# Patient Record
Sex: Female | Born: 1955 | Hispanic: Refuse to answer | Marital: Married | Smoking: Never smoker
Health system: Southern US, Community
[De-identification: ages and names within clinical notes are randomized; demographics above are authoritative.]

## PROBLEM LIST (undated history)

## (undated) DIAGNOSIS — G4730 Sleep apnea, unspecified: Secondary | ICD-10-CM

## (undated) DIAGNOSIS — M810 Age-related osteoporosis without current pathological fracture: Secondary | ICD-10-CM

## (undated) DIAGNOSIS — I499 Cardiac arrhythmia, unspecified: Secondary | ICD-10-CM

## (undated) DIAGNOSIS — E785 Hyperlipidemia, unspecified: Secondary | ICD-10-CM

## (undated) DIAGNOSIS — F458 Other somatoform disorders: Secondary | ICD-10-CM

## (undated) DIAGNOSIS — M1990 Unspecified osteoarthritis, unspecified site: Secondary | ICD-10-CM

## (undated) HISTORY — DX: Cardiac arrhythmia, unspecified: I49.9

## (undated) HISTORY — PX: COLONOSCOPY: SHX174

## (undated) HISTORY — DX: Hyperlipidemia, unspecified: E78.5

## (undated) HISTORY — DX: Age-related osteoporosis without current pathological fracture: M81.0

## (undated) HISTORY — PX: TONSILLECTOMY: SUR1361

## (undated) HISTORY — DX: Sleep apnea, unspecified: G47.30

## (undated) HISTORY — DX: Unspecified osteoarthritis, unspecified site: M19.90

## (undated) HISTORY — DX: Other somatoform disorders: F45.8

## (undated) HISTORY — PX: BREAST BIOPSY: SHX20

---

## 2011-12-09 ENCOUNTER — Other Ambulatory Visit (HOSPITAL_COMMUNITY)
Admission: RE | Admit: 2011-12-09 | Discharge: 2011-12-09 | Disposition: A | Discharge status: Home / Self Care | Payer: 59 | Source: Ambulatory Visit | Attending: Family Medicine | Admitting: Family Medicine

## 2011-12-09 ENCOUNTER — Other Ambulatory Visit: Payer: Self-pay | Admitting: Family Medicine

## 2011-12-09 DIAGNOSIS — Z1231 Encounter for screening mammogram for malignant neoplasm of breast: Secondary | ICD-10-CM

## 2011-12-09 DIAGNOSIS — Z124 Encounter for screening for malignant neoplasm of cervix: Secondary | ICD-10-CM | POA: Insufficient documentation

## 2011-12-09 DIAGNOSIS — Z1159 Encounter for screening for other viral diseases: Secondary | ICD-10-CM | POA: Insufficient documentation

## 2011-12-19 ENCOUNTER — Ambulatory Visit
Admission: RE | Admit: 2011-12-19 | Discharge: 2011-12-19 | Disposition: A | Discharge status: Home / Self Care | Payer: 59 | Source: Ambulatory Visit | Attending: Family Medicine | Admitting: Family Medicine

## 2011-12-19 DIAGNOSIS — Z1231 Encounter for screening mammogram for malignant neoplasm of breast: Secondary | ICD-10-CM

## 2013-04-12 ENCOUNTER — Other Ambulatory Visit: Payer: Self-pay

## 2013-04-12 DIAGNOSIS — Z1231 Encounter for screening mammogram for malignant neoplasm of breast: Secondary | ICD-10-CM

## 2013-04-30 ENCOUNTER — Ambulatory Visit
Admission: RE | Admit: 2013-04-30 | Discharge: 2013-04-30 | Disposition: A | Discharge status: Home / Self Care | Payer: 59 | Source: Ambulatory Visit

## 2013-04-30 DIAGNOSIS — Z1231 Encounter for screening mammogram for malignant neoplasm of breast: Secondary | ICD-10-CM

## 2013-06-07 ENCOUNTER — Ambulatory Visit (HOSPITAL_BASED_OUTPATIENT_CLINIC_OR_DEPARTMENT_OTHER): Payer: 59 | Attending: Family Medicine | Admitting: Radiology

## 2013-06-07 VITALS — Ht 67.0 in | Wt 150.0 lb

## 2013-06-07 DIAGNOSIS — R0989 Other specified symptoms and signs involving the circulatory and respiratory systems: Secondary | ICD-10-CM | POA: Insufficient documentation

## 2013-06-07 DIAGNOSIS — R0609 Other forms of dyspnea: Secondary | ICD-10-CM | POA: Insufficient documentation

## 2013-06-07 DIAGNOSIS — G4733 Obstructive sleep apnea (adult) (pediatric): Secondary | ICD-10-CM | POA: Insufficient documentation

## 2013-06-12 DIAGNOSIS — G4733 Obstructive sleep apnea (adult) (pediatric): Secondary | ICD-10-CM

## 2013-06-12 NOTE — Procedures (Signed)
NAMEKELLSEY, [name]               ACCOUNT NO.:  [id number]  MEDICAL RECORD NO.:  [id number]          PATIENT TYPE:  OUT  LOCATION:  SLEEP CENTER                 FACILITY:  [facility]  PHYSICIAN:  Soul Hackman D. Maple Hudson, MD, FCCP, FACPDATE OF BIRTH:  02[birth year]15-1956  DATE OF STUDY:  06/07/2013                           NOCTURNAL POLYSOMNOGRAM  REFERRING PHYSICIAN:  Carilyn Goodpasture  REFERRING DOCTOR:  Carilyn Goodpasture, PA  INDICATION FOR STUDY:  Insomnia with sleep apnea.  EPWORTH SLEEPINESS SCORE:  11/24.  BMI 23.5, weight 150 pounds, height 67 inches, neck 13.5 inches.  MEDICATIONS:  Home medications charted for review.  SLEEP ARCHITECTURE:  Total sleep time 211 minutes with sleep efficiency 58.4%.  Stage I was 11.8%, stage II 69.2%, stage III 0.5%, REM 18.5% of total sleep time.  Sleep latency 77 minutes.  REM latency 95 minutes. Awake after sleep onset 72.5 minutes.  Arousal index 27.3.  Bedtime medication:  None.  She had difficulty sustaining sleep until 1:30 a.m.  RESPIRATORY DATA:  Apnea-hypopnea index (AHI) 6.3 per hour.  A total of 22 events was scored including 2 obstructive apneas and 20 hypopneas. Events were all associated with nonsupine sleep or with REM.  REM AHI 32.3 per hour.  Most events occurred around 2:00 a.m. while she was sleeping on her right side.  There were not of enough events to meet protocol criteria for application of split protocol CPAP titration.  OXYGEN DATA:  Moderate snoring with oxygen desaturation to a nadir of 82% and mean oxygen saturation through the study of 93.6% on room air.  CARDIAC DATA:  Normal sinus rhythm with occasional PAC.  MOVEMENT-PARASOMNIA:  No significant movement disturbance.  No bathroom trips.  IMPRESSION/RECOMMENDATION: 1. Mild obstructive sleep apnea/hypopnea syndrome, AHI 6.3 per hour     with mainly nonsupine and REM associated events.  REM AHI 32.3 per     hour.  Most events occurred in 1 cluster around 2 a.m. while lying     on her right side. 2. She did not have enough early sleep or events to meet protocol     requirements for CPAP trial.  Scores in this range are usually     managed conservatively, but if clinically     appropriate, a trial of CPAP or perhaps an oral appliance could be     considered. 3. She had significant difficulty initiating and maintaining sleep     until nearly 1:30 a.m.  If this is her usual pattern     at home, then managing as insomnia, with attention to sleep hygiene and     consideration of a sleep medication might be clinically appropriate.     Anber Mckiver D. Maple Hudson, MD, Tonny Bollman, FACP Diplomate, American Board of Sleep Medicine    CDY/MEDQ  D:  06/12/2013 09:58:34  T:  06/12/2013 10:23:48  Job:  [id number]

## 2013-08-01 ENCOUNTER — Ambulatory Visit (HOSPITAL_BASED_OUTPATIENT_CLINIC_OR_DEPARTMENT_OTHER): Payer: 59 | Attending: Family Medicine

## 2013-08-01 VITALS — Ht 67.0 in | Wt 153.0 lb

## 2013-08-01 DIAGNOSIS — G4733 Obstructive sleep apnea (adult) (pediatric): Secondary | ICD-10-CM

## 2013-08-01 DIAGNOSIS — G4710 Hypersomnia, unspecified: Secondary | ICD-10-CM | POA: Insufficient documentation

## 2013-08-07 DIAGNOSIS — G4733 Obstructive sleep apnea (adult) (pediatric): Secondary | ICD-10-CM

## 2013-08-07 NOTE — Sleep Study (Signed)
   NAME: Pamela Griffith DATE OF BIRTH:  28-Sep-1955 MEDICAL RECORD NUMBER [id number]  LOCATION: [facility]  PHYSICIAN: Kinnley Paulson D  DATE OF STUDY: 08/01/2013  SLEEP STUDY TYPE: Nocturnal Polysomnogram               REFERRING PHYSICIAN: Carilyn Goodpasture, PA-C  INDICATION FOR STUDY: Hypersomnia with sleep apnea. A baseline nocturnal polysomnogram on 06/07/2013 recorded AHI 6.3 per hour with body weight 150 pounds. CPAP titration is requested.  EPWORTH SLEEPINESS SCORE:   9/24 HEIGHT: 5\' 7"  (170.2 cm)  WEIGHT: 153 lb (69.4 kg)    Body mass index is 23.96 kg/(m^2).  NECK SIZE: 13.5 in.  MEDICATIONS: Charted for review  SLEEP ARCHITECTURE: Total sleep time 291 minutes with sleep efficiency 77.4%. Stage I was 6.5%, stage II 65.6%, stage III 6.7%, REM 21.1% of total sleep time. Sleep latency 37 minutes, REM latency 125 minutes. Awake after sleep onset 46.5 minutes, arousal index 15.1. Bedtime medication: None.  RESPIRATORY DATA: CPAP titration protocol. CPAP was titrated to 8 CWP. She wore a medium Fisher Paykel Eson nasal mask.  OXYGEN DATA: Snoring was prevented on final CPAP pressures and mean oxygen saturation held 94.9% on room air.  CARDIAC DATA: Normal sinus rhythm  MOVEMENT/PARASOMNIA: No significant movement disturbance. No bathroom trips.  IMPRESSION/ RECOMMENDATION:   1) Successful CPAP titration to 8 CWP, AHI 0 per hour. She wore a medium Fisher Paykel Eson mask, EPR 3, heated humidifier. Snoring was prevented and mean oxygen saturation of 94.9% on room air. 2) Baseline nocturnal polysomnogram on 06/07/2013 recorded AHI 6.3 per hour. Body weight was 150 pounds.   Signed Jetty Duhamel M.D. Waymon Budge Diplomate, American Board of Sleep Medicine  ELECTRONICALLY SIGNED ON:  08/07/2013, 2:58 PM [facility] PH: [phone]   FX: [phone] ACCREDITED BY THE AMERICAN ACADEMY OF SLEEP MEDICINE

## 2013-11-15 ENCOUNTER — Telehealth: Payer: Self-pay | Admitting: Internal Medicine

## 2013-11-15 NOTE — Telephone Encounter (Signed)
Spoke with Pamela HatchAnn at [facility]. Pt is having issues with her CPAP machine. Armanda Heritagedvised Pamela Griffith that we have not seen this pt in the office. [facility] read her sleep study in 07/2013 which was ordered by Carilyn GoodpastureJennifer Willard, PA. Pamela Hatchnn is wanting to get pt set up with an appointment to get her issues taken care of. Advised Pamela Griffith that [facility] will not have any openings any time soon. Pt has been scheduled with [facility] on 11/29/13 at 9:15am.  Pamela Hatchnn will contact the pt will appointment date and time. Pt will call back if this day and time doesn't work for her.

## 2013-11-15 NOTE — Telephone Encounter (Signed)
Left message for Dewayne HatchAnn (clinical and lab supervisor at [facility]) x1

## 2013-11-29 ENCOUNTER — Encounter (INDEPENDENT_AMBULATORY_CARE_PROVIDER_SITE_OTHER): Payer: Self-pay

## 2013-11-29 ENCOUNTER — Encounter: Payer: Self-pay | Admitting: Pulmonary Disease

## 2013-11-29 ENCOUNTER — Ambulatory Visit (INDEPENDENT_AMBULATORY_CARE_PROVIDER_SITE_OTHER): Payer: 59 | Admitting: Pulmonary Disease

## 2013-11-29 VITALS — BP 112/64 | HR 66 | Temp 98.0°F | Ht 66.0 in | Wt 159.0 lb

## 2013-11-29 DIAGNOSIS — G4733 Obstructive sleep apnea (adult) (pediatric): Secondary | ICD-10-CM

## 2013-11-29 NOTE — Assessment & Plan Note (Signed)
She has snoring, sleep disruption, witnessed apnea, and daytime sleepiness.  Her recent sleep study showed mild obstructive sleep apnea, but with significant REM effect.  I have reviewed the recent sleep study results with the patient.  We discussed how sleep apnea can affect various health problems including risks for hypertension, cardiovascular disease, and diabetes.  We also discussed how sleep disruption can increase risks for accident, such as while driving.  Weight loss as a means of improving sleep apnea was also reviewed.  Additional treatment options discussed were CPAP therapy, oral appliance, and surgical intervention.  She has difficulty adjusting to CPAP due to pressure setting, mask fit, and aerophagia.  She is willing to continue with CPAP for now.  Will decrease her CPAP setting from 8 to 6 cm H2O and arrange for CPAP mask refit.  If these efforts are unsuccessful, then she may need to be evaluated for an oral appliance.  She will check with her dentist about whether there may be an issue with her using an oral appliance especially with difficulty with TMJ clicking.

## 2013-11-29 NOTE — Patient Instructions (Signed)
Will change CPAP setting to 6 cm H2O and arrange for new CPAP mask Follow up in 6 weeks

## 2013-11-29 NOTE — Progress Notes (Signed)
Chief Complaint  Patient presents with  . Sleep Consult    referred by Carilyn GoodpastureJennifer Willard, PA for sleep issues. Epworth Score: 9.    History of Present Illness: Pamela Griffith is a 58 y.o. female for evaluation of sleep problems.  Her husband has been concerned about her snoring, and that she stops breathing while asleep.  This has been getting worse.  She will also wake up feeling short of breath and her heart pounding.  As a result she had a sleep study in October 2014 which showed mild sleep apnea.  She then had CPAP titration in December 2014.  She was started on CPAP 8 cm H2O with nasal pillows masks.  She has not been able to tolerate using CPAP.  As a result she was referred to pulmonary/sleep medicine.  She feels like she can't breath when she uses CPAP sometimes.  She also has been swallowing air into her stomach > this causes so much pain she has to wake up.  She also sleeps on her stomach, and was concerned that other mask types would not work for her.  She has developed anxiety about not being able to sleep while using CPAP > she was tried on xanax, but this did not help.  She feels her CPAP setting is too high.  As a result she has not used CPAP since April 2015.  At most she used CPAP for 4 hours in one night > when she does use CPAP it helps her sleep quality.  She does get sleepy during the day, and will fall asleep in the evening when sitting quiet.  She has a history of bruxism, and wears a mouth guard.  She goes to sleep at 11 pm.  She falls asleep after 20 minutes when not using CPAP, but takes 1 to 3 hours with CPAP.  She wakes up one time to use the bathroom.  She gets out of bed at 7 am.  She feels tire in the morning.  She denies morning headache.  She does not use anything to help her fall sleep or stay awake.  She denies sleep walking, or nightmares.  There is no history of restless legs.  She denies sleep hallucinations, sleep paralysis, or cataplexy.  The Epworth score is  9 out of 24.  Tests: PSG 06/07/13 >> AHI 6.3, REM AHI 32.3, SpO2 low 82% CPAP titration 08/01/13 >> CPAP 8 cm H2O  Pamela Griffith  has a past medical history of Abnormal heart rhythm; Hyperlipidemia; Sleep apnea; and Bruxism.  Pamela Griffith  has past surgical history that includes Breast biopsy and Tonsillectomy.  Prior to Admission medications   Medication Sig Start Date End Date Taking? Authorizing Provider  ALPRAZolam Prudy Feeler(XANAX) 0.5 MG tablet Take 0.5 mg by mouth at bedtime as needed for anxiety.   Yes Historical Provider, MD    Allergies  Allergen Reactions  . Penicillins Rash    Her family history includes Cancer in her mother; Heart disease in her father and mother.  She  reports that she has never smoked. She has never used smokeless tobacco. She reports that she drinks alcohol. She reports that she does not use illicit drugs.  Review of Systems  Constitutional: Negative for fever, chills, diaphoresis, activity change, appetite change, fatigue and unexpected weight change.  HENT: Negative for congestion, dental problem, ear discharge, ear pain, facial swelling, hearing loss, mouth sores, nosebleeds, postnasal drip, rhinorrhea, sinus pressure, sneezing, sore throat, tinnitus, trouble swallowing and voice change.  Eyes: Negative for photophobia, discharge, itching and visual disturbance.  Respiratory: Negative for apnea, cough, choking, chest tightness, shortness of breath, wheezing and stridor.   Cardiovascular: Positive for palpitations. Negative for chest pain and leg swelling.  Gastrointestinal: Positive for abdominal pain. Negative for nausea, vomiting, constipation, blood in stool and abdominal distention.  Genitourinary: Negative for dysuria, urgency, frequency, hematuria, flank pain, decreased urine volume and difficulty urinating.  Musculoskeletal: Negative for arthralgias, back pain, gait problem, joint swelling, myalgias, neck pain and neck stiffness.  Skin: Negative for  color change, pallor and rash.  Neurological: Negative for dizziness, tremors, seizures, syncope, speech difficulty, weakness, light-headedness, numbness and headaches.  Hematological: Negative for adenopathy. Does not bruise/bleed easily.  Psychiatric/Behavioral: Negative for confusion, sleep disturbance and agitation. The patient is not nervous/anxious.    Physical Exam:  General - No distress ENT - No sinus tenderness, no oral exudate, no LAN, no thyromegaly, TM clear, pupils equal/reactive, click in TMJ, MP 2, decreased AP diameter, high arch palate Cardiac - s1s2 regular, no murmur, pulses symmetric Chest - No wheeze/rales/dullness, good air entry, normal respiratory excursion Back - No focal tenderness Abd - Soft, non-tender, no organomegaly, + bowel sounds Ext - No edema Neuro - Normal strength, cranial nerves intact Skin - No rashes Psych - Normal mood, and behavior  Assessment/plan:  Coralyn HellingVineet Heela Heishman, M.D. Pager [phone]

## 2013-11-29 NOTE — Progress Notes (Deleted)
   Subjective:    Patient ID: Pamela Griffith, female    DOB: [birth year], 58 y.o.   MRN: [id number]  HPI    Review of Systems  Constitutional: Negative for fever, chills, diaphoresis, activity change, appetite change, fatigue and unexpected weight change.  HENT: Negative for congestion, dental problem, ear discharge, ear pain, facial swelling, hearing loss, mouth sores, nosebleeds, postnasal drip, rhinorrhea, sinus pressure, sneezing, sore throat, tinnitus, trouble swallowing and voice change.   Eyes: Negative for photophobia, discharge, itching and visual disturbance.  Respiratory: Negative for apnea, cough, choking, chest tightness, shortness of breath, wheezing and stridor.   Cardiovascular: Positive for palpitations. Negative for chest pain and leg swelling.  Gastrointestinal: Positive for abdominal pain. Negative for nausea, vomiting, constipation, blood in stool and abdominal distention.  Genitourinary: Negative for dysuria, urgency, frequency, hematuria, flank pain, decreased urine volume and difficulty urinating.  Musculoskeletal: Negative for arthralgias, back pain, gait problem, joint swelling, myalgias, neck pain and neck stiffness.  Skin: Negative for color change, pallor and rash.  Neurological: Negative for dizziness, tremors, seizures, syncope, speech difficulty, weakness, light-headedness, numbness and headaches.  Hematological: Negative for adenopathy. Does not bruise/bleed easily.  Psychiatric/Behavioral: Negative for confusion, sleep disturbance and agitation. The patient is not nervous/anxious.        Objective:   Physical Exam        Assessment & Plan:

## 2013-12-31 ENCOUNTER — Telehealth: Payer: Self-pay | Admitting: Pulmonary Disease

## 2013-12-31 NOTE — Telephone Encounter (Signed)
Noted  

## 2013-12-31 NOTE — Telephone Encounter (Signed)
Noted will send to [facility] as an [place]

## 2014-06-16 ENCOUNTER — Ambulatory Visit: Payer: 59 | Admitting: Pulmonary Disease

## 2015-05-04 ENCOUNTER — Other Ambulatory Visit: Payer: Self-pay

## 2015-05-04 DIAGNOSIS — Z1231 Encounter for screening mammogram for malignant neoplasm of breast: Secondary | ICD-10-CM

## 2015-05-15 ENCOUNTER — Telehealth: Payer: Self-pay | Admitting: Gastroenterology

## 2015-05-15 NOTE — Telephone Encounter (Signed)
Received GI records from [facility] and placed on Dr. Christella Hartigan desk for review.

## 2015-06-12 ENCOUNTER — Ambulatory Visit
Admission: RE | Admit: 2015-06-12 | Discharge: 2015-06-12 | Disposition: A | Discharge status: Home / Self Care | Payer: 59 | Source: Ambulatory Visit

## 2015-06-12 DIAGNOSIS — Z1231 Encounter for screening mammogram for malignant neoplasm of breast: Secondary | ICD-10-CM

## 2016-05-10 ENCOUNTER — Other Ambulatory Visit (HOSPITAL_COMMUNITY)
Admission: RE | Admit: 2016-05-10 | Discharge: 2016-05-10 | Disposition: A | Discharge status: Home / Self Care | Payer: Managed Care, Other (non HMO) | Source: Ambulatory Visit | Attending: Family Medicine | Admitting: Family Medicine

## 2016-05-10 ENCOUNTER — Other Ambulatory Visit: Payer: Self-pay | Admitting: Family Medicine

## 2016-05-10 DIAGNOSIS — Z01411 Encounter for gynecological examination (general) (routine) with abnormal findings: Secondary | ICD-10-CM | POA: Diagnosis not present

## 2016-05-14 LAB — CYTOLOGY - PAP

## 2016-05-16 ENCOUNTER — Other Ambulatory Visit: Payer: Self-pay | Admitting: Family Medicine

## 2016-05-16 DIAGNOSIS — Z1231 Encounter for screening mammogram for malignant neoplasm of breast: Secondary | ICD-10-CM

## 2016-06-17 ENCOUNTER — Ambulatory Visit
Admission: RE | Admit: 2016-06-17 | Discharge: 2016-06-17 | Disposition: A | Discharge status: Home / Self Care | Payer: Managed Care, Other (non HMO) | Source: Ambulatory Visit | Attending: Family Medicine | Admitting: Family Medicine

## 2016-06-17 DIAGNOSIS — Z1231 Encounter for screening mammogram for malignant neoplasm of breast: Secondary | ICD-10-CM

## 2017-05-13 ENCOUNTER — Other Ambulatory Visit: Payer: Self-pay | Admitting: Family Medicine

## 2017-05-13 DIAGNOSIS — Z1231 Encounter for screening mammogram for malignant neoplasm of breast: Secondary | ICD-10-CM

## 2017-06-23 ENCOUNTER — Ambulatory Visit: Payer: Managed Care, Other (non HMO)

## 2017-06-30 ENCOUNTER — Ambulatory Visit
Admission: RE | Admit: 2017-06-30 | Discharge: 2017-06-30 | Disposition: A | Discharge status: Home / Self Care | Payer: Managed Care, Other (non HMO) | Source: Ambulatory Visit | Attending: Family Medicine | Admitting: Family Medicine

## 2017-06-30 DIAGNOSIS — Z1231 Encounter for screening mammogram for malignant neoplasm of breast: Secondary | ICD-10-CM

## 2018-06-11 ENCOUNTER — Other Ambulatory Visit: Payer: Self-pay | Admitting: Family Medicine

## 2018-06-11 DIAGNOSIS — Z1231 Encounter for screening mammogram for malignant neoplasm of breast: Secondary | ICD-10-CM

## 2018-07-23 ENCOUNTER — Ambulatory Visit: Payer: Managed Care, Other (non HMO)

## 2019-02-05 ENCOUNTER — Encounter: Payer: Self-pay | Admitting: Gastroenterology

## 2019-09-15 DIAGNOSIS — R69 Illness, unspecified: Secondary | ICD-10-CM | POA: Diagnosis not present

## 2019-12-29 DIAGNOSIS — R7301 Impaired fasting glucose: Secondary | ICD-10-CM | POA: Diagnosis not present

## 2019-12-29 DIAGNOSIS — E785 Hyperlipidemia, unspecified: Secondary | ICD-10-CM | POA: Diagnosis not present

## 2019-12-29 DIAGNOSIS — E559 Vitamin D deficiency, unspecified: Secondary | ICD-10-CM | POA: Diagnosis not present

## 2020-01-03 DIAGNOSIS — Z23 Encounter for immunization: Secondary | ICD-10-CM | POA: Diagnosis not present

## 2020-01-03 DIAGNOSIS — M189 Osteoarthritis of first carpometacarpal joint, unspecified: Secondary | ICD-10-CM | POA: Diagnosis not present

## 2020-01-03 DIAGNOSIS — R21 Rash and other nonspecific skin eruption: Secondary | ICD-10-CM | POA: Diagnosis not present

## 2020-01-03 DIAGNOSIS — Z1211 Encounter for screening for malignant neoplasm of colon: Secondary | ICD-10-CM | POA: Diagnosis not present

## 2020-01-03 DIAGNOSIS — E559 Vitamin D deficiency, unspecified: Secondary | ICD-10-CM | POA: Diagnosis not present

## 2020-01-03 DIAGNOSIS — G4733 Obstructive sleep apnea (adult) (pediatric): Secondary | ICD-10-CM | POA: Diagnosis not present

## 2020-01-03 DIAGNOSIS — R7301 Impaired fasting glucose: Secondary | ICD-10-CM | POA: Diagnosis not present

## 2020-01-03 DIAGNOSIS — E785 Hyperlipidemia, unspecified: Secondary | ICD-10-CM | POA: Diagnosis not present

## 2020-01-03 DIAGNOSIS — Z0000 Encounter for general adult medical examination without abnormal findings: Secondary | ICD-10-CM | POA: Diagnosis not present

## 2020-04-10 DIAGNOSIS — H5203 Hypermetropia, bilateral: Secondary | ICD-10-CM | POA: Diagnosis not present

## 2020-04-10 DIAGNOSIS — Z0100 Encounter for examination of eyes and vision without abnormal findings: Secondary | ICD-10-CM | POA: Diagnosis not present

## 2020-04-17 DIAGNOSIS — R69 Illness, unspecified: Secondary | ICD-10-CM | POA: Diagnosis not present

## 2020-05-16 DIAGNOSIS — Z23 Encounter for immunization: Secondary | ICD-10-CM | POA: Diagnosis not present

## 2020-07-04 DIAGNOSIS — R69 Illness, unspecified: Secondary | ICD-10-CM | POA: Diagnosis not present

## 2021-01-23 DIAGNOSIS — R7303 Prediabetes: Secondary | ICD-10-CM | POA: Diagnosis not present

## 2021-01-23 DIAGNOSIS — E785 Hyperlipidemia, unspecified: Secondary | ICD-10-CM | POA: Diagnosis not present

## 2021-01-23 DIAGNOSIS — E559 Vitamin D deficiency, unspecified: Secondary | ICD-10-CM | POA: Diagnosis not present

## 2021-01-25 DIAGNOSIS — Z0000 Encounter for general adult medical examination without abnormal findings: Secondary | ICD-10-CM | POA: Diagnosis not present

## 2021-01-25 DIAGNOSIS — N952 Postmenopausal atrophic vaginitis: Secondary | ICD-10-CM | POA: Diagnosis not present

## 2021-01-25 DIAGNOSIS — G4733 Obstructive sleep apnea (adult) (pediatric): Secondary | ICD-10-CM | POA: Diagnosis not present

## 2021-01-25 DIAGNOSIS — E785 Hyperlipidemia, unspecified: Secondary | ICD-10-CM | POA: Diagnosis not present

## 2021-01-25 DIAGNOSIS — E559 Vitamin D deficiency, unspecified: Secondary | ICD-10-CM | POA: Diagnosis not present

## 2021-01-25 DIAGNOSIS — M189 Osteoarthritis of first carpometacarpal joint, unspecified: Secondary | ICD-10-CM | POA: Diagnosis not present

## 2021-01-25 DIAGNOSIS — R7303 Prediabetes: Secondary | ICD-10-CM | POA: Diagnosis not present

## 2021-01-25 DIAGNOSIS — M549 Dorsalgia, unspecified: Secondary | ICD-10-CM | POA: Diagnosis not present

## 2021-01-25 DIAGNOSIS — Z1211 Encounter for screening for malignant neoplasm of colon: Secondary | ICD-10-CM | POA: Diagnosis not present

## 2021-01-25 DIAGNOSIS — M5135 Other intervertebral disc degeneration, thoracolumbar region: Secondary | ICD-10-CM | POA: Diagnosis not present

## 2021-01-26 ENCOUNTER — Ambulatory Visit
Admission: RE | Admit: 2021-01-26 | Discharge: 2021-01-26 | Disposition: A | Discharge status: Home / Self Care | Payer: Medicare HMO | Source: Ambulatory Visit | Attending: Family Medicine | Admitting: Family Medicine

## 2021-01-26 ENCOUNTER — Other Ambulatory Visit: Payer: Self-pay | Admitting: Family Medicine

## 2021-01-26 DIAGNOSIS — M546 Pain in thoracic spine: Secondary | ICD-10-CM

## 2021-01-26 DIAGNOSIS — M4316 Spondylolisthesis, lumbar region: Secondary | ICD-10-CM | POA: Diagnosis not present

## 2021-01-26 DIAGNOSIS — M47817 Spondylosis without myelopathy or radiculopathy, lumbosacral region: Secondary | ICD-10-CM | POA: Diagnosis not present

## 2021-01-26 DIAGNOSIS — M48061 Spinal stenosis, lumbar region without neurogenic claudication: Secondary | ICD-10-CM | POA: Diagnosis not present

## 2021-01-26 DIAGNOSIS — M47816 Spondylosis without myelopathy or radiculopathy, lumbar region: Secondary | ICD-10-CM | POA: Diagnosis not present

## 2021-02-26 DIAGNOSIS — M546 Pain in thoracic spine: Secondary | ICD-10-CM | POA: Diagnosis not present

## 2021-02-28 DIAGNOSIS — M419 Scoliosis, unspecified: Secondary | ICD-10-CM | POA: Diagnosis not present

## 2021-02-28 DIAGNOSIS — M545 Low back pain, unspecified: Secondary | ICD-10-CM | POA: Diagnosis not present

## 2021-02-28 DIAGNOSIS — M6281 Muscle weakness (generalized): Secondary | ICD-10-CM | POA: Diagnosis not present

## 2021-05-04 DIAGNOSIS — Z23 Encounter for immunization: Secondary | ICD-10-CM | POA: Diagnosis not present

## 2021-05-08 DIAGNOSIS — N644 Mastodynia: Secondary | ICD-10-CM | POA: Diagnosis not present

## 2021-05-08 DIAGNOSIS — R922 Inconclusive mammogram: Secondary | ICD-10-CM | POA: Diagnosis not present

## 2021-07-05 DIAGNOSIS — H5203 Hypermetropia, bilateral: Secondary | ICD-10-CM | POA: Diagnosis not present

## 2021-08-07 DIAGNOSIS — Z0100 Encounter for examination of eyes and vision without abnormal findings: Secondary | ICD-10-CM | POA: Diagnosis not present

## 2021-08-22 DIAGNOSIS — M1712 Unilateral primary osteoarthritis, left knee: Secondary | ICD-10-CM | POA: Diagnosis not present

## 2021-09-05 DIAGNOSIS — S83242A Other tear of medial meniscus, current injury, left knee, initial encounter: Secondary | ICD-10-CM | POA: Diagnosis not present

## 2021-09-05 DIAGNOSIS — M1712 Unilateral primary osteoarthritis, left knee: Secondary | ICD-10-CM | POA: Diagnosis not present

## 2022-01-25 DIAGNOSIS — E559 Vitamin D deficiency, unspecified: Secondary | ICD-10-CM | POA: Diagnosis not present

## 2022-01-25 DIAGNOSIS — R7303 Prediabetes: Secondary | ICD-10-CM | POA: Diagnosis not present

## 2022-01-25 DIAGNOSIS — E785 Hyperlipidemia, unspecified: Secondary | ICD-10-CM | POA: Diagnosis not present

## 2022-02-06 DIAGNOSIS — Z23 Encounter for immunization: Secondary | ICD-10-CM | POA: Diagnosis not present

## 2022-02-06 DIAGNOSIS — G4733 Obstructive sleep apnea (adult) (pediatric): Secondary | ICD-10-CM | POA: Diagnosis not present

## 2022-02-06 DIAGNOSIS — M5135 Other intervertebral disc degeneration, thoracolumbar region: Secondary | ICD-10-CM | POA: Diagnosis not present

## 2022-02-06 DIAGNOSIS — Z1211 Encounter for screening for malignant neoplasm of colon: Secondary | ICD-10-CM | POA: Diagnosis not present

## 2022-02-06 DIAGNOSIS — R7301 Impaired fasting glucose: Secondary | ICD-10-CM | POA: Diagnosis not present

## 2022-02-06 DIAGNOSIS — E785 Hyperlipidemia, unspecified: Secondary | ICD-10-CM | POA: Diagnosis not present

## 2022-02-06 DIAGNOSIS — Z0000 Encounter for general adult medical examination without abnormal findings: Secondary | ICD-10-CM | POA: Diagnosis not present

## 2022-02-06 DIAGNOSIS — E559 Vitamin D deficiency, unspecified: Secondary | ICD-10-CM | POA: Diagnosis not present

## 2022-02-18 DIAGNOSIS — M810 Age-related osteoporosis without current pathological fracture: Secondary | ICD-10-CM | POA: Diagnosis not present

## 2022-02-18 DIAGNOSIS — M85851 Other specified disorders of bone density and structure, right thigh: Secondary | ICD-10-CM | POA: Diagnosis not present

## 2022-02-18 DIAGNOSIS — M85852 Other specified disorders of bone density and structure, left thigh: Secondary | ICD-10-CM | POA: Diagnosis not present

## 2022-02-18 DIAGNOSIS — Z780 Asymptomatic menopausal state: Secondary | ICD-10-CM | POA: Diagnosis not present

## 2022-03-01 ENCOUNTER — Encounter: Payer: Self-pay | Admitting: Gastroenterology

## 2022-03-14 ENCOUNTER — Other Ambulatory Visit: Payer: Self-pay | Admitting: *Deleted

## 2022-04-02 ENCOUNTER — Ambulatory Visit (AMBULATORY_SURGERY_CENTER): Payer: Self-pay | Admitting: *Deleted

## 2022-04-02 VITALS — Ht 66.0 in | Wt 146.0 lb

## 2022-04-02 DIAGNOSIS — Z1211 Encounter for screening for malignant neoplasm of colon: Secondary | ICD-10-CM

## 2022-04-02 NOTE — Progress Notes (Signed)
No egg or soy allergy known to patient  No issues known to pt with past sedation with any surgeries or procedures Patient denies ever being told they had issues or difficulty with intubation  No FH of Malignant Hyperthermia Pt is not on diet pills Pt is not on  home 02  Pt is not on blood thinners  Pt denies issues with constipation  No A fib or A flutter Have any cardiac testing pending--NO Pt instructed to use [employer] or GoodRx for a price reduction on prep   Sample sheet of over the counter items to purchase for prep given to pt.  

## 2022-04-11 ENCOUNTER — Encounter: Payer: Self-pay | Admitting: Internal Medicine

## 2022-04-23 ENCOUNTER — Encounter: Payer: Self-pay | Admitting: Internal Medicine

## 2022-04-23 ENCOUNTER — Encounter: Payer: Medicare HMO | Admitting: Gastroenterology

## 2022-04-23 ENCOUNTER — Ambulatory Visit (AMBULATORY_SURGERY_CENTER): Payer: Medicare HMO | Admitting: Internal Medicine

## 2022-04-23 VITALS — BP 111/53 | HR 60 | Temp 97.5°F | Resp 13 | Ht 66.0 in | Wt 146.0 lb

## 2022-04-23 DIAGNOSIS — Z1211 Encounter for screening for malignant neoplasm of colon: Secondary | ICD-10-CM

## 2022-04-23 MED ORDER — SODIUM CHLORIDE 0.9 % IV SOLN: 500.0000 mL | Freq: Once | INTRAVENOUS | Status: DC

## 2022-04-23 NOTE — Progress Notes (Signed)
To pacu, VSS. Report to [email or web address] 

## 2022-04-23 NOTE — Progress Notes (Signed)
Pt's states no medical or surgical changes since previsit or office visit. 

## 2022-04-23 NOTE — Op Note (Signed)
[facility] Patient Name: Pamela Griffith Procedure Date: 04/23/2022 3:51 PM MRN: [id number] Endoscopist: Iva Boop , MD Age: 66 Referring MD:  Date of Birth: [birth year] Gender: Female Account #: [id number] Procedure:                Colonoscopy Indications:              Screening for colorectal malignant neoplasm, Last                            colonoscopy: 2010 Medicines:                Monitored Anesthesia Care Procedure:                Pre-Anesthesia Assessment:                           - Prior to the procedure, a History and Physical                            was performed, and patient medications and                            allergies were reviewed. The patient's tolerance of                            previous anesthesia was also reviewed. The risks                            and benefits of the procedure and the sedation                            options and risks were discussed with the patient.                            All questions were answered, and informed consent                            was obtained. Prior Anticoagulants: The patient has                            taken no previous anticoagulant or antiplatelet                            agents. ASA Grade Assessment: II - A patient with                            mild systemic disease. After reviewing the risks                            and benefits, the patient was deemed in                            satisfactory condition to undergo the procedure.  After obtaining informed consent, the colonoscope                            was passed under direct vision. Throughout the                            procedure, the patient's blood pressure, pulse, and                            oxygen saturations were monitored continuously. The                            Olympus PCF-H190DL ([id number]) Colonoscope was                            introduced through the anus and advanced to  the the                            cecum, identified by appendiceal orifice and                            ileocecal valve. The colonoscopy was performed                            without difficulty. The patient tolerated the                            procedure well. The quality of the bowel                            preparation was good. The bowel preparation used                            was Miralax via split dose instruction. The                            ileocecal valve, appendiceal orifice, and rectum                            were photographed. Scope In: 3:56:05 PM Scope Out: 4:10:25 PM Scope Withdrawal Time: 0 hours 11 minutes 19 seconds  Total Procedure Duration: 0 hours 14 minutes 20 seconds  Findings:                 The perianal and digital rectal examinations were                            normal.                           Multiple diverticula were found in the sigmoid                            colon.  External and internal hemorrhoids were found.                           The exam was otherwise without abnormality on                            direct and retroflexion views. Complications:            No immediate complications. Estimated Blood Loss:     Estimated blood loss: none. Impression:               - Diverticulosis in the sigmoid colon.                           - External and internal hemorrhoids.                           - The examination was otherwise normal on direct                            and retroflexion views.                           - No specimens collected. Recommendation:           - Patient has a contact number available for                            emergencies. The signs and symptoms of potential                            delayed complications were discussed with the                            patient. Return to normal activities tomorrow.                            Written discharge instructions were provided  to the                            patient.                           - Resume previous diet.                           - Continue present medications.                           - No repeat colonoscopy due to current age (14                            years or older) and the absence of colonic polyps.                            May discuss w/ PCP at age 51 in 10 years.  Investigate signs/symptoms as appropriate. Iva Boop, MD 04/23/2022 4:21:01 PM This report has been signed electronically.

## 2022-04-23 NOTE — Patient Instructions (Addendum)
I did not see any polyps or cancer today.  You do have diverticulosis - thickened muscle rings and pouches in the colon wall. Please read the handout about this condition. Hemorrhoids were also seen.  I do not typically recommend routine repeat colonoscopy after age 66. Next routine exam for you would be at age 70. You may discuss it with primary care at that time but I am not putting you on a list to contact you then. Signs or symptoms may warrant investigation at any age, however.  I appreciate the opportunity to care for you. Iva Boop, MD, FACG  YOU HAD AN ENDOSCOPIC PROCEDURE TODAY AT THE [facility]:   Refer to the procedure report that was given to you for any specific questions about what was found during the examination.  If the procedure report does not answer your questions, please call your gastroenterologist to clarify.  If you requested that your care partner not be given the details of your procedure findings, then the procedure report has been included in a sealed envelope for you to review at your convenience later.  YOU SHOULD EXPECT: Some feelings of bloating in the abdomen. Passage of more gas than usual.  Walking can help get rid of the air that was put into your GI tract during the procedure and reduce the bloating. If you had a lower endoscopy (such as a colonoscopy or flexible sigmoidoscopy) you may notice spotting of blood in your stool or on the toilet paper. If you underwent a bowel prep for your procedure, you may not have a normal bowel movement for a few days.  Please Note:  You might notice some irritation and congestion in your nose or some drainage.  This is from the oxygen used during your procedure.  There is no need for concern and it should clear up in a day or so.  SYMPTOMS TO REPORT IMMEDIATELY:  Following lower endoscopy (colonoscopy or flexible sigmoidoscopy):  Excessive amounts of blood in the stool  Significant tenderness or worsening  of abdominal pains  Swelling of the abdomen that is new, acute  Fever of 100F or higher  For urgent or emergent issues, a gastroenterologist can be reached at any hour by calling [phone]. Do not use MyChart messaging for urgent concerns.    DIET:  We do recommend a small meal at first, but then you may proceed to your regular diet.  Drink plenty of fluids but you should avoid alcoholic beverages for 24 hours.  ACTIVITY:  You should plan to take it easy for the rest of today and you should NOT DRIVE or use heavy machinery until tomorrow (because of the sedation medicines used during the test).    FOLLOW UP: Our staff will call the number listed on your records the next business day following your procedure.  We will call around 7:15- 8:00 am to check on you and address any questions or concerns that you may have regarding the information given to you following your procedure. If we do not reach you, we will leave a message.  If you develop any symptoms (ie: fever, flu-like symptoms, shortness of breath, cough etc.) before then, please call [phone].  If you test positive for Covid 19 in the 2 weeks post procedure, please call and report this information to [place].     SIGNATURES/CONFIDENTIALITY: You and/or your care partner have signed paperwork which will be entered into your electronic medical record.  These signatures attest to the fact  that that the information above on your After Visit Summary has been reviewed and is understood.  Full responsibility of the confidentiality of this discharge information lies with you and/or your care-partner.

## 2022-04-23 NOTE — Progress Notes (Signed)
[facility] History and Physical   Primary Care Physician:  Mila Palmer, MD   Reason for Procedure:   CRCA screening  Plan:    colonoscopy     HPI: Pamela Griffith is a 66 y.o. adult here for screening exam Negative colonoscopy 2010 ([place])  Some tectal pain pre and after prep - dull ache.  Past Medical History:  Diagnosis Date   Abnormal heart rhythm    Arthritis    Bruxism    Hyperlipidemia    Osteoporosis    Sleep apnea    PT.DENIES    Past Surgical History:  Procedure Laterality Date   BREAST BIOPSY     COLONOSCOPY     TONSILLECTOMY      Prior to Admission medications   Medication Sig Start Date End Date Taking? Authorizing Provider  Calcium Citrate-Vitamin D (CITRACAL + D PO) 2 tablets   Yes [provider]  Multiple Vitamins-Minerals (CENTRUM SILVER ULTRA WOMENS PO) 1 tablet   Yes [provider]  tiZANidine (ZANAFLEX) 4 MG tablet 1 tablet as needed    [provider]  triamcinolone cream (KENALOG) 0.1 % APPLY TOPICALLY TO AFFECTED AREA TWICE DAILY AS NEEDED    [provider]    Current Outpatient Medications  Medication Sig Dispense Refill   Calcium Citrate-Vitamin D (CITRACAL + D PO) 2 tablets     Multiple Vitamins-Minerals (CENTRUM SILVER ULTRA WOMENS PO) 1 tablet     tiZANidine (ZANAFLEX) 4 MG tablet 1 tablet as needed     triamcinolone cream (KENALOG) 0.1 % APPLY TOPICALLY TO AFFECTED AREA TWICE DAILY AS NEEDED     Current Facility-Administered Medications  Medication Dose Route Frequency Provider Last Rate Last Admin   0.9 %  sodium chloride infusion  500 mL Intravenous Once Iva Boop, MD        Allergies as of 04/23/2022 - Review Complete 04/23/2022  Allergen Reaction Noted   Sulfa antibiotics Other (See Comments) 05/09/2015   Penicillins Rash 11/29/2013    Family History  Problem Relation Age of Onset   Heart disease Mother    Cancer Mother    Breast cancer Mother    Colon polyps  Father    Heart disease Father    Breast cancer Maternal Aunt    Crohn's disease Daughter    Colon cancer Neg Hx    Esophageal cancer Neg Hx    Rectal cancer Neg Hx    Stomach cancer Neg Hx     Social History   Socioeconomic History   Marital status: Married    Spouse name: Not on file   Number of children: Not on file   Years of education: Not on file   Highest education level: Not on file  Occupational History   Not on file  Tobacco Use   Smoking status: Never    Passive exposure: Never   Smokeless tobacco: Never  Vaping Use   Vaping Use: Never used  Substance and Sexual Activity   Alcohol use: Yes    Comment: 2 X A WEEK 2-3 DRINKS   Drug use: No   Sexual activity: Not on file  Other Topics Concern   Not on file  Social History Narrative   Not on file   Social Determinants of Health   Financial Resource Strain: Not on file  Food Insecurity: Not on file  Transportation Needs: Not on file  Physical Activity: Not on file  Stress: Not on file  Social Connections: Not on file  Intimate Partner Violence: Not on file    Review of Systems:  All other review of systems negative except as mentioned in the HPI.  Physical Exam: Vital signs BP 129/68   Pulse 85   Temp (!) 97.5 F (36.4 C)   Ht 5\' 6"  (1.676 m)   Wt 146 lb (66.2 kg)   SpO2 98%   BMI 23.57 kg/m   General:   Alert,  Well-developed, well-nourished, pleasant and cooperative in NAD Lungs:  Clear throughout to auscultation.   Heart:  Regular rate and rhythm; no murmurs, clicks, rubs,  or gallops. Abdomen:  Soft, nontender and nondistended. Normal bowel sounds.   Neuro/Psych:  Alert and cooperative. Normal mood and affect. A and O x 3   @Ikia Cincotta  , MD, [facility] [phone] (pager) 04/23/2022 3:47 PM@

## 2022-04-24 ENCOUNTER — Telehealth: Payer: Self-pay | Admitting: *Deleted

## 2022-04-24 NOTE — Telephone Encounter (Signed)
Attempted to call patient for their post-procedure follow-up call. No answer. Left voicemail.   

## 2022-04-30 DIAGNOSIS — Z23 Encounter for immunization: Secondary | ICD-10-CM | POA: Diagnosis not present

## 2022-07-09 DIAGNOSIS — J018 Other acute sinusitis: Secondary | ICD-10-CM | POA: Diagnosis not present

## 2022-07-17 DIAGNOSIS — Z03818 Encounter for observation for suspected exposure to other biological agents ruled out: Secondary | ICD-10-CM | POA: Diagnosis not present

## 2022-07-17 DIAGNOSIS — R0981 Nasal congestion: Secondary | ICD-10-CM | POA: Diagnosis not present

## 2022-07-17 DIAGNOSIS — R062 Wheezing: Secondary | ICD-10-CM | POA: Diagnosis not present

## 2022-11-18 IMAGING — CR DG THORACOLUMBAR SPINE 2V
2 series · 2 of 2 positions shown · non-contrast
Comparison: No pertinent prior exams available for comparison.

CLINICAL DATA: Thoracic back pain, unspecified back pain
laterality, unspecified chronicity. Additional history provided by
scanning technologist: Patient reports pain on right side of upper
lumbar/lower thoracic spine exacerbated by bending over for years.

EXAM:
THORACOLUMBAR SPINE 1V

[w t-spine/l-spine junc. ap]
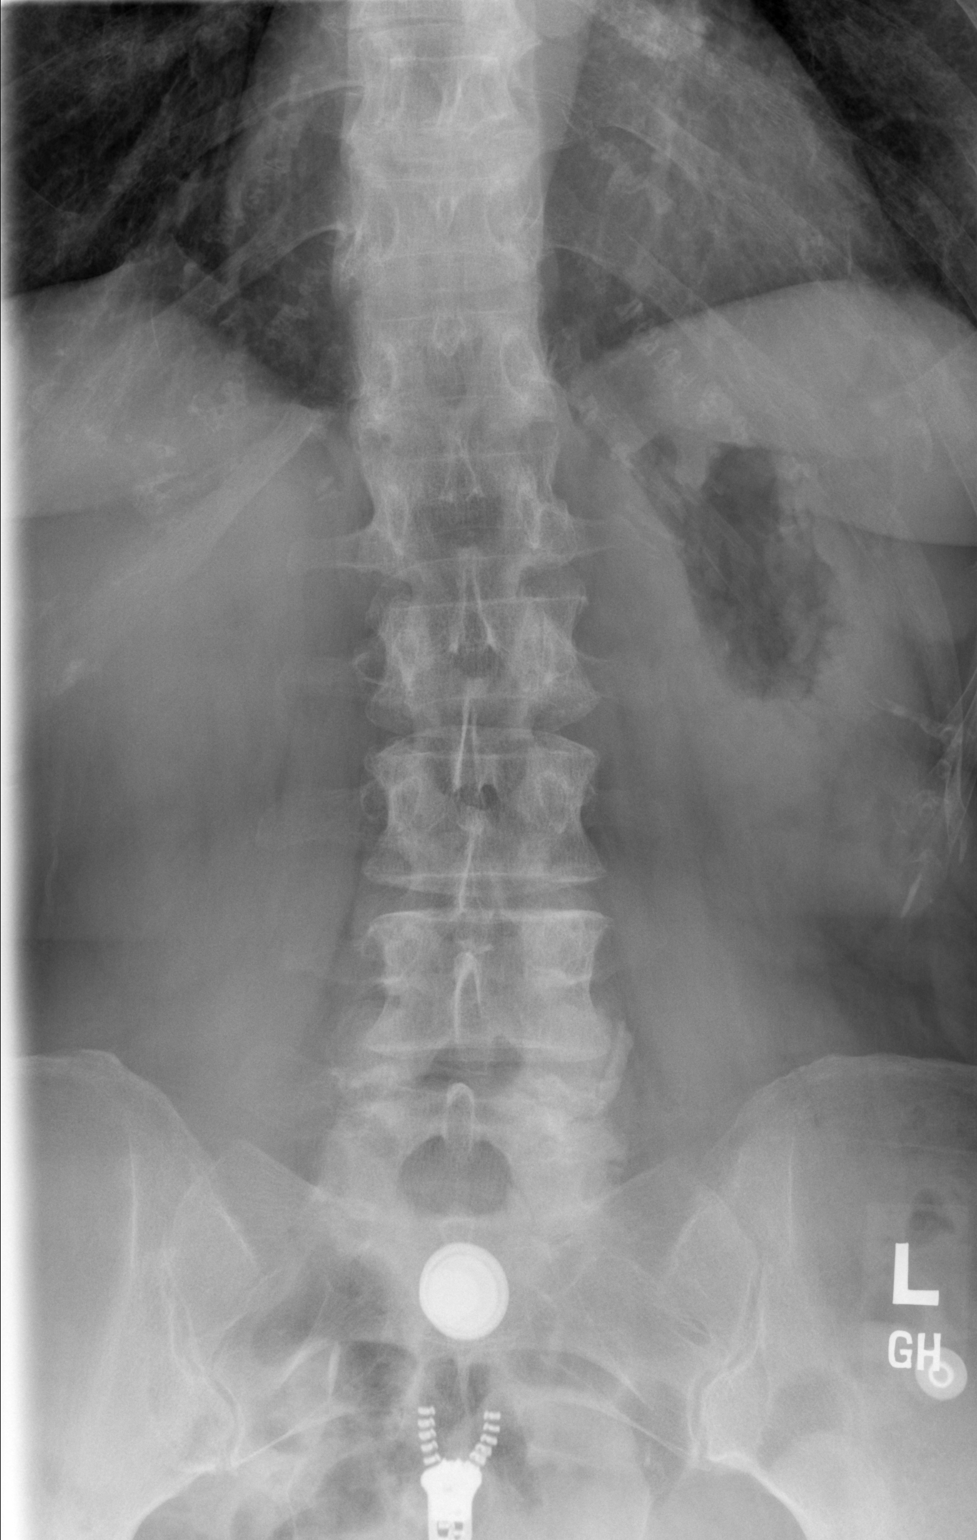

[w t-spine/l-spine junc lat]
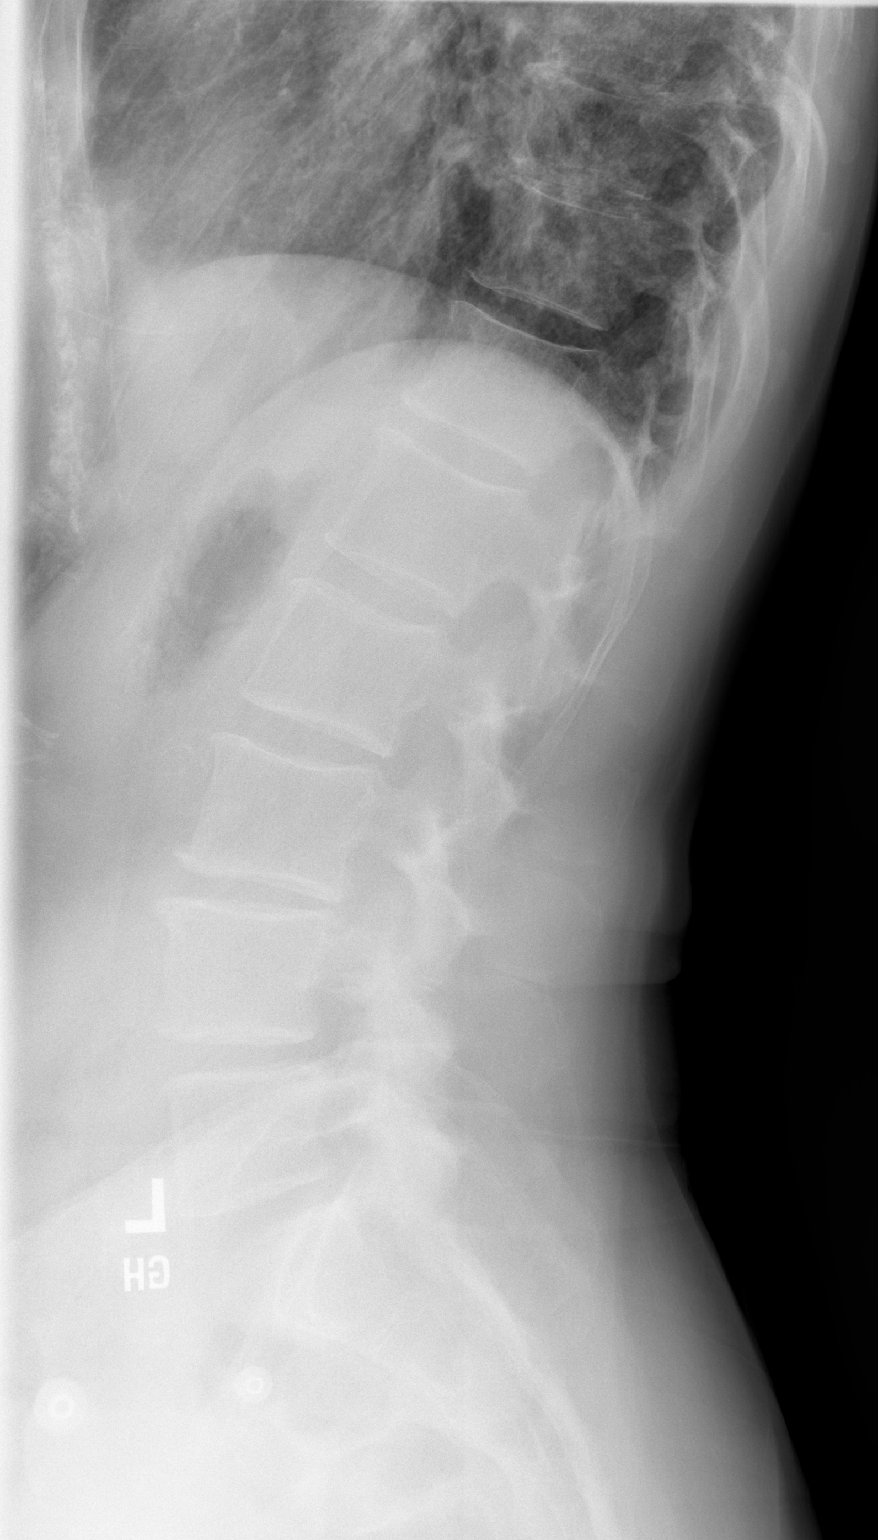

[2 of 2 positions shown; findings below may reference images not displayed]

FINDINGS: The thoracolumbar spine is imaged from the T10 level to the L5
level.

Mild lumbar levocurvature. Trace L1-L2 and L2-L3 grade 1
retrolisthesis.

No vertebral compression fracture.

Moderate disc space narrowing at L3-L4. No more than mild disc space
narrowing at the remaining levels. Multilevel facet arthrosis,
greatest at L4-L5 and L5-S1.
IMPRESSION: The thoracolumbar spine is imaged from the T10 level to the L5
level.

No vertebral compression fracture.

Multilevel disc space narrowing greatest at L3-L4 (moderate in
severity at this level).

Multilevel facet arthrosis, greatest at L4-L5 and L5-S1.

Mild lumbar levocurvature.

Trace L1-L2 and L2-L3 grade 1 retrolisthesis.

## 2022-11-29 DIAGNOSIS — H938X2 Other specified disorders of left ear: Secondary | ICD-10-CM | POA: Diagnosis not present

## 2022-11-29 DIAGNOSIS — H9312 Tinnitus, left ear: Secondary | ICD-10-CM | POA: Diagnosis not present

## 2022-12-10 DIAGNOSIS — H938X2 Other specified disorders of left ear: Secondary | ICD-10-CM | POA: Diagnosis not present

## 2022-12-10 DIAGNOSIS — H9042 Sensorineural hearing loss, unilateral, left ear, with unrestricted hearing on the contralateral side: Secondary | ICD-10-CM | POA: Diagnosis not present

## 2022-12-10 DIAGNOSIS — L298 Other pruritus: Secondary | ICD-10-CM | POA: Diagnosis not present

## 2022-12-10 DIAGNOSIS — H9312 Tinnitus, left ear: Secondary | ICD-10-CM | POA: Diagnosis not present

## 2022-12-13 DIAGNOSIS — H90A32 Mixed conductive and sensorineural hearing loss, unilateral, left ear with restricted hearing on the contralateral side: Secondary | ICD-10-CM | POA: Diagnosis not present

## 2023-01-02 ENCOUNTER — Ambulatory Visit
Admission: RE | Admit: 2023-01-02 | Discharge: 2023-01-02 | Disposition: A | Discharge status: Home / Self Care | Payer: Medicare HMO | Source: Ambulatory Visit | Attending: Family Medicine | Admitting: Family Medicine

## 2023-01-02 ENCOUNTER — Other Ambulatory Visit: Payer: Self-pay | Admitting: Family Medicine

## 2023-01-02 DIAGNOSIS — R051 Acute cough: Secondary | ICD-10-CM | POA: Diagnosis not present

## 2023-01-02 DIAGNOSIS — R059 Cough, unspecified: Secondary | ICD-10-CM | POA: Diagnosis not present

## 2023-01-02 DIAGNOSIS — J189 Pneumonia, unspecified organism: Secondary | ICD-10-CM | POA: Diagnosis not present

## 2023-01-02 DIAGNOSIS — J101 Influenza due to other identified influenza virus with other respiratory manifestations: Secondary | ICD-10-CM | POA: Diagnosis not present

## 2023-01-02 DIAGNOSIS — R509 Fever, unspecified: Secondary | ICD-10-CM | POA: Diagnosis not present

## 2023-01-02 DIAGNOSIS — Z03818 Encounter for observation for suspected exposure to other biological agents ruled out: Secondary | ICD-10-CM | POA: Diagnosis not present

## 2023-01-02 DIAGNOSIS — J069 Acute upper respiratory infection, unspecified: Secondary | ICD-10-CM | POA: Diagnosis not present

## 2023-01-09 ENCOUNTER — Other Ambulatory Visit (HOSPITAL_COMMUNITY): Payer: Self-pay | Admitting: Family Medicine

## 2023-01-09 DIAGNOSIS — Z79899 Other long term (current) drug therapy: Secondary | ICD-10-CM | POA: Diagnosis not present

## 2023-01-09 DIAGNOSIS — R9389 Abnormal findings on diagnostic imaging of other specified body structures: Secondary | ICD-10-CM

## 2023-01-10 ENCOUNTER — Ambulatory Visit (HOSPITAL_BASED_OUTPATIENT_CLINIC_OR_DEPARTMENT_OTHER)
Admission: RE | Admit: 2023-01-10 | Discharge: 2023-01-10 | Disposition: A | Discharge status: Home / Self Care | Payer: Medicare HMO | Source: Ambulatory Visit | Attending: Family Medicine | Admitting: Family Medicine

## 2023-01-10 ENCOUNTER — Encounter (HOSPITAL_BASED_OUTPATIENT_CLINIC_OR_DEPARTMENT_OTHER): Payer: Self-pay

## 2023-01-10 DIAGNOSIS — R9389 Abnormal findings on diagnostic imaging of other specified body structures: Secondary | ICD-10-CM | POA: Insufficient documentation

## 2023-01-10 MED ORDER — IOHEXOL 300 MG/ML  SOLN
65.0000 mL | Freq: Once | INTRAMUSCULAR | Status: AC | PRN
  Administered 2023-01-10: 65 mL via INTRAVENOUS

## 2023-01-15 DIAGNOSIS — I700 Atherosclerosis of aorta: Secondary | ICD-10-CM | POA: Diagnosis not present

## 2023-01-15 DIAGNOSIS — R9389 Abnormal findings on diagnostic imaging of other specified body structures: Secondary | ICD-10-CM | POA: Diagnosis not present

## 2023-02-03 NOTE — Progress Notes (Signed)
Synopsis: Referred in June 2024 for abnormal CT chest by Mila Palmer, MD  Subjective:   PATIENT ID: Pamela Griffith GENDER: female DOB: July 18, 1956, MRN: [id number]  Chief Complaint  Patient presents with   Consult    Consult for lung nodule.    This is a 67 year old female, past medical history of hyperlipidemia, osteoporosis.  Patient was referred after having CT scan of the chest in May 2024.  Patient was found to have architectural distortion of the lungs with traction bronchiectasis and postinflammatory scarring.  Other small calcified granulomas within the chest.  She has multiple ill-defined solid and part solid nodules that were felt to be possibly postinflammatory.  Patient is a lifelong non-smoker.  From respiratory standpoint she is able to complete her activities of daily living.  She gets on an elliptical 5 days/week.  Over the past year or more she did show a approximate 10 to 15% decrease in her endurance but she was not sure if it was just her getting older or not.    Past Medical History:  Diagnosis Date   Abnormal heart rhythm    Arthritis    Bruxism    Hyperlipidemia    Osteoporosis    Sleep apnea    PT.DENIES     Family History  Problem Relation Age of Onset   Heart disease Mother    Cancer Mother    Breast cancer Mother    Colon polyps Father    Heart disease Father    Breast cancer Maternal Aunt    Crohn's disease Daughter    Colon cancer Neg Hx    Esophageal cancer Neg Hx    Rectal cancer Neg Hx    Stomach cancer Neg Hx      Past Surgical History:  Procedure Laterality Date   BREAST BIOPSY     COLONOSCOPY     TONSILLECTOMY      Social History   Socioeconomic History   Marital status: Married    Spouse name: Not on file   Number of children: Not on file   Years of education: Not on file   Highest education level: Not on file  Occupational History   Not on file  Tobacco Use   Smoking status: Never    Passive exposure: Never    Smokeless tobacco: Never  Vaping Use   Vaping Use: Never used  Substance and Sexual Activity   Alcohol use: Yes    Comment: 2 X A WEEK 2-3 DRINKS   Drug use: No   Sexual activity: Not on file  Other Topics Concern   Not on file  Social History Narrative   Not on file   Social Determinants of Health   Financial Resource Strain: Not on file  Food Insecurity: Not on file  Transportation Needs: Not on file  Physical Activity: Not on file  Stress: Not on file  Social Connections: Not on file  Intimate Partner Violence: Not on file     Allergies  Allergen Reactions   Sulfa Antibiotics Other (See Comments)   Penicillins Rash     Outpatient Medications Prior to Visit  Medication Sig Dispense Refill   cholecalciferol (VITAMIN D3) 25 MCG (1000 UNIT) tablet Take 1,000 Units by mouth daily.     fluticasone (FLONASE) 50 MCG/ACT nasal spray Place 2 sprays into both nostrils daily. PRN     Multiple Vitamins-Minerals (CENTRUM SILVER ULTRA WOMENS PO) 1 tablet     tiZANidine (ZANAFLEX) 4 MG tablet 1 tablet as needed  triamcinolone cream (KENALOG) 0.1 % APPLY TOPICALLY TO AFFECTED AREA TWICE DAILY AS NEEDED     Calcium Citrate-Vitamin D (CITRACAL + D PO) 2 tablets     No facility-administered medications prior to visit.    Review of Systems  Constitutional:  Negative for chills, fever, malaise/fatigue and weight loss.  HENT:  Negative for hearing loss, sore throat and tinnitus.   Eyes:  Negative for blurred vision and double vision.  Respiratory:  Positive for shortness of breath. Negative for cough, hemoptysis, sputum production, wheezing and stridor.   Cardiovascular:  Negative for chest pain, palpitations, orthopnea, leg swelling and PND.  Gastrointestinal:  Negative for abdominal pain, constipation, diarrhea, heartburn, nausea and vomiting.  Genitourinary:  Negative for dysuria, hematuria and urgency.  Musculoskeletal:  Negative for joint pain and myalgias.  Skin:  Negative for  itching and rash.  Neurological:  Negative for dizziness, tingling, weakness and headaches.  Endo/Heme/Allergies:  Negative for environmental allergies. Does not bruise/bleed easily.  Psychiatric/Behavioral:  Negative for depression. The patient is not nervous/anxious and does not have insomnia.   All other systems reviewed and are negative.    Objective:  Physical Exam Vitals reviewed.  Constitutional:      General: She is not in acute distress.    Appearance: She is well-developed.  HENT:     Head: Normocephalic and atraumatic.  Eyes:     General: No scleral icterus.    Conjunctiva/sclera: Conjunctivae normal.     Pupils: Pupils are equal, round, and reactive to light.  Neck:     Vascular: No JVD.     Trachea: No tracheal deviation.  Cardiovascular:     Rate and Rhythm: Normal rate and regular rhythm.     Heart sounds: Normal heart sounds. No murmur heard. Pulmonary:     Effort: Pulmonary effort is normal. No tachypnea, accessory muscle usage or respiratory distress.     Breath sounds: No stridor. No wheezing, rhonchi or rales.  Abdominal:     General: There is no distension.     Palpations: Abdomen is soft.     Tenderness: There is no abdominal tenderness.  Musculoskeletal:        General: No tenderness.     Cervical back: Neck supple.  Lymphadenopathy:     Cervical: No cervical adenopathy.  Skin:    General: Skin is warm and dry.     Capillary Refill: Capillary refill takes less than 2 seconds.     Findings: No rash.  Neurological:     Mental Status: She is alert and oriented to person, place, and time.  Psychiatric:        Behavior: Behavior normal.      Vitals:   02/04/23 0851  BP: 120/80  Pulse: 81  SpO2: 100%  Weight: 139 lb 12.8 oz (63.4 kg)  Height: 5\' 6"  (1.676 m)   100% on RA BMI Readings from Last 3 Encounters:  02/04/23 22.56 kg/m  04/23/22 23.57 kg/m  04/02/22 23.57 kg/m   Wt Readings from Last 3 Encounters:  02/04/23 139 lb 12.8 oz  (63.4 kg)  04/23/22 146 lb (66.2 kg)  04/02/22 146 lb (66.2 kg)     CBC No results found for: "WBC", "RBC", "HGB", "HCT", "PLT", "MCV", "MCH", "MCHC", "RDW", "LYMPHSABS", "MONOABS", "EOSABS", "BASOSABS"   Chest Imaging: 01/10/2023 CT chest: Upper lobe predominant bronchiectasis scattered nodules. The patient's images have been independently reviewed by me.    Pulmonary Functions Testing Results:     No data to display  FeNO:   Pathology:   Echocardiogram:   Heart Catheterization:     Assessment & Plan:     ICD-10-CM   1. Lung nodules  R91.8 Pulmonary Function Test    CT CHEST HIGH RESOLUTION    2. Bronchiectasis without complication (HCC)  J47.9 Pulmonary Function Test    CT CHEST HIGH RESOLUTION    3. 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  No previous diagnosis of sarcoidosis.  Plan: Her CT imaging to me is suggested of sarcoidosis. She has not had any biopsies to prove presence of noncaseating granuloma. She currently does not have any respiratory symptoms able to completely her ADLs. Will obtain pulmonary function tests. We will also have HRCT imaging complete in 3 months this will also follow-up on her pulmonary nodules. I have recommended her to have a 30-minute consultation with Dr. Everardo All in her sarcoid clinic. Will have her follow-up in 3 months.  In the future if needed we could always consider biopsy of her lung to help confirm diagnosis.  RTC 3 months with Dr. Everardo All at [facility] office.   Current Outpatient Medications:    cholecalciferol (VITAMIN D3) 25 MCG (1000 UNIT) tablet, Take 1,000 Units by mouth daily.,  Disp: , Rfl:    fluticasone (FLONASE) 50 MCG/ACT nasal spray, Place 2 sprays into both nostrils daily. PRN, Disp: , Rfl:    Multiple Vitamins-Minerals (CENTRUM SILVER ULTRA WOMENS PO), 1 tablet, Disp: , Rfl:    tiZANidine (ZANAFLEX) 4 MG tablet, 1 tablet as needed, Disp: , Rfl:    triamcinolone cream (KENALOG) 0.1 %, APPLY TOPICALLY TO AFFECTED AREA TWICE DAILY AS NEEDED, Disp: , Rfl:    Pamela Igo, DO [facility] 02/04/2023 9:09 AM

## 2023-02-04 ENCOUNTER — Ambulatory Visit: Payer: Medicare HMO | Admitting: Pulmonary Disease

## 2023-02-04 ENCOUNTER — Encounter: Payer: Self-pay | Admitting: Pulmonary Disease

## 2023-02-04 VITALS — BP 120/80 | HR 81 | Ht 66.0 in | Wt 139.8 lb

## 2023-02-04 DIAGNOSIS — J8410 Pulmonary fibrosis, unspecified: Secondary | ICD-10-CM

## 2023-02-04 DIAGNOSIS — R918 Other nonspecific abnormal finding of lung field: Secondary | ICD-10-CM | POA: Diagnosis not present

## 2023-02-04 DIAGNOSIS — J479 Bronchiectasis, uncomplicated: Secondary | ICD-10-CM | POA: Diagnosis not present

## 2023-02-04 NOTE — Patient Instructions (Signed)
Thank you for visiting Dr. Tonia Brooms at [facility]. Today we recommend the following:  Orders Placed This Encounter  Procedures   CT CHEST HIGH RESOLUTION   Pulmonary Function Test   Please schedule PFTs and HRCT prior to appt with Dr. Everardo All   Return in about 3 months (around 05/07/2023) for w/ Dr. Everardo All . Sarcoid consult     Please do your part to reduce the spread of COVID-19.

## 2023-02-13 DIAGNOSIS — R9431 Abnormal electrocardiogram [ECG] [EKG]: Secondary | ICD-10-CM | POA: Diagnosis not present

## 2023-02-13 DIAGNOSIS — Z1330 Encounter for screening examination for mental health and behavioral disorders, unspecified: Secondary | ICD-10-CM | POA: Diagnosis not present

## 2023-02-13 DIAGNOSIS — R002 Palpitations: Secondary | ICD-10-CM | POA: Diagnosis not present

## 2023-02-25 DIAGNOSIS — R9431 Abnormal electrocardiogram [ECG] [EKG]: Secondary | ICD-10-CM | POA: Diagnosis not present

## 2023-02-25 DIAGNOSIS — R002 Palpitations: Secondary | ICD-10-CM | POA: Diagnosis not present

## 2023-02-27 DIAGNOSIS — E785 Hyperlipidemia, unspecified: Secondary | ICD-10-CM | POA: Diagnosis not present

## 2023-02-27 DIAGNOSIS — J8410 Pulmonary fibrosis, unspecified: Secondary | ICD-10-CM | POA: Diagnosis not present

## 2023-02-27 DIAGNOSIS — E559 Vitamin D deficiency, unspecified: Secondary | ICD-10-CM | POA: Diagnosis not present

## 2023-02-27 DIAGNOSIS — M810 Age-related osteoporosis without current pathological fracture: Secondary | ICD-10-CM | POA: Diagnosis not present

## 2023-02-27 DIAGNOSIS — Z0000 Encounter for general adult medical examination without abnormal findings: Secondary | ICD-10-CM | POA: Diagnosis not present

## 2023-02-27 DIAGNOSIS — R002 Palpitations: Secondary | ICD-10-CM | POA: Diagnosis not present

## 2023-02-27 DIAGNOSIS — J479 Bronchiectasis, uncomplicated: Secondary | ICD-10-CM | POA: Diagnosis not present

## 2023-02-27 DIAGNOSIS — R7301 Impaired fasting glucose: Secondary | ICD-10-CM | POA: Diagnosis not present

## 2023-02-27 DIAGNOSIS — I700 Atherosclerosis of aorta: Secondary | ICD-10-CM | POA: Diagnosis not present

## 2023-03-10 DIAGNOSIS — R002 Palpitations: Secondary | ICD-10-CM | POA: Diagnosis not present

## 2023-03-10 DIAGNOSIS — R9431 Abnormal electrocardiogram [ECG] [EKG]: Secondary | ICD-10-CM | POA: Diagnosis not present

## 2023-03-27 DIAGNOSIS — I471 Supraventricular tachycardia, unspecified: Secondary | ICD-10-CM | POA: Diagnosis not present

## 2023-03-27 DIAGNOSIS — R9431 Abnormal electrocardiogram [ECG] [EKG]: Secondary | ICD-10-CM | POA: Diagnosis not present

## 2023-03-27 DIAGNOSIS — R002 Palpitations: Secondary | ICD-10-CM | POA: Diagnosis not present

## 2023-04-07 ENCOUNTER — Ambulatory Visit (HOSPITAL_BASED_OUTPATIENT_CLINIC_OR_DEPARTMENT_OTHER)
Admission: RE | Admit: 2023-04-07 | Discharge: 2023-04-07 | Disposition: A | Discharge status: Home / Self Care | Payer: Medicare HMO | Source: Ambulatory Visit

## 2023-04-07 DIAGNOSIS — J984 Other disorders of lung: Secondary | ICD-10-CM | POA: Diagnosis not present

## 2023-04-07 DIAGNOSIS — R590 Localized enlarged lymph nodes: Secondary | ICD-10-CM | POA: Diagnosis not present

## 2023-04-07 DIAGNOSIS — R918 Other nonspecific abnormal finding of lung field: Secondary | ICD-10-CM | POA: Insufficient documentation

## 2023-04-07 DIAGNOSIS — J479 Bronchiectasis, uncomplicated: Secondary | ICD-10-CM | POA: Diagnosis not present

## 2023-05-02 ENCOUNTER — Ambulatory Visit (HOSPITAL_BASED_OUTPATIENT_CLINIC_OR_DEPARTMENT_OTHER): Payer: Medicare HMO | Admitting: Pulmonary Disease

## 2023-05-02 ENCOUNTER — Encounter (HOSPITAL_BASED_OUTPATIENT_CLINIC_OR_DEPARTMENT_OTHER): Payer: Self-pay | Admitting: Pulmonary Disease

## 2023-05-02 VITALS — BP 112/60 | HR 71 | Resp 16 | Ht 66.0 in | Wt 132.6 lb

## 2023-05-02 DIAGNOSIS — D869 Sarcoidosis, unspecified: Secondary | ICD-10-CM | POA: Diagnosis not present

## 2023-05-02 DIAGNOSIS — J479 Bronchiectasis, uncomplicated: Secondary | ICD-10-CM

## 2023-05-02 DIAGNOSIS — R918 Other nonspecific abnormal finding of lung field: Secondary | ICD-10-CM | POA: Diagnosis not present

## 2023-05-02 LAB — PULMONARY FUNCTION TEST
DL/VA % pred: 130 %
DL/VA: 5.33 ml/min/mmHg/L
DLCO cor % pred: 122 %
DLCO cor: 25.77 ml/min/mmHg
DLCO unc % pred: 122 %
DLCO unc: 25.77 ml/min/mmHg
FEF 25-75 Post: 3.08 L/s
FEF 25-75 Pre: 2.32 L/s
FEF2575-%Change-Post: 32 %
FEF2575-%Pred-Post: 145 %
FEF2575-%Pred-Pre: 110 %
FEV1-%Change-Post: 7 %
FEV1-%Pred-Post: 111 %
FEV1-%Pred-Pre: 104 %
FEV1-Post: 2.82 L
FEV1-Pre: 2.63 L
FEV1FVC-%Change-Post: 4 %
FEV1FVC-%Pred-Pre: 102 %
FEV6-%Change-Post: 2 %
FEV6-%Pred-Post: 109 %
FEV6-%Pred-Pre: 106 %
FEV6-Post: 3.46 L
FEV6-Pre: 3.37 L
FEV6FVC-%Pred-Post: 104 %
FEV6FVC-%Pred-Pre: 104 %
FVC-%Change-Post: 2 %
FVC-%Pred-Post: 104 %
FVC-%Pred-Pre: 101 %
FVC-Post: 3.46 L
FVC-Pre: 3.37 L
Post FEV1/FVC ratio: 81 %
Post FEV6/FVC ratio: 100 %
Pre FEV1/FVC ratio: 78 %
Pre FEV6/FVC Ratio: 100 %
RV % pred: 91 %
RV: 2.05 L
TLC % pred: 102 %
TLC: 5.46 L

## 2023-05-02 NOTE — Patient Instructions (Signed)
Full PFT Performed Today  

## 2023-05-02 NOTE — Progress Notes (Signed)
Full PFT Performed Today  

## 2023-05-02 NOTE — Patient Instructions (Addendum)
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  --Annual PFTs.  Last PFTs 04/2023 --Annual ophthalmology exam.   --Labs per PCP  Follow-up in 1 year

## 2023-05-02 NOTE — Progress Notes (Unsigned)
Synopsis: Referred in June 2024 for abnormal CT chest by Mila Palmer, MD  Subjective:   PATIENT ID: Pamela Griffith GENDER: female DOB: 1956/06/28, MRN: [id number]  Chief Complaint  Patient presents with   Follow-up    This is a 67 year old female, past medical history of hyperlipidemia, osteoporosis.  Patient was referred after having CT scan of the chest in May 2024.  Patient was found to have architectural distortion of the lungs with traction bronchiectasis and postinflammatory scarring.  Other small calcified granulomas within the chest.  She has multiple ill-defined solid and part solid nodules that were felt to be possibly postinflammatory.  Patient is a lifelong non-smoker.  From respiratory standpoint she is able to complete her activities of daily living.  She gets on an elliptical 5 days/week.  Over the past year or more she did show a approximate 10 to 15% decrease in her endurance but she was not sure if it was just her getting older or not.  05/02/23 67 year old female never smoker with suspected sarcoid, HLD, osteoporosis, arthritis, heart palpitations who presents as new patient for suspected sarcoid.  Initially she reports symptoms of influenza pneumonia in 12/2022 which led to CT Chest 01/10/23 with chronic perihilar lung disease, traction bronchiectasis and scarring. Several calcified granulomas. Scattered non calcified ill defined solid and part solid nodules in both lungs. RUL 1 x 0.5 cm, perifisural 0.6 cm in the RML and 0.4 cm LUL nodule. She reports prolonged sickness manifested as a sinus infection in November and May lasting >1 month. She is active with exercise 5 days a week. Denies shortness of breath cough or wheezing.  Past Medical History:  Diagnosis Date   Abnormal heart rhythm    Arthritis    Bruxism    Hyperlipidemia    Osteoporosis    Sleep apnea    PT.DENIES     Family History  Problem Relation Age of Onset   Heart disease Mother    Cancer Mother     Breast cancer Mother    Colon polyps Father    Heart disease Father    Breast cancer Maternal Aunt    Crohn's disease Daughter    Colon cancer Neg Hx    Esophageal cancer Neg Hx    Rectal cancer Neg Hx    Stomach cancer Neg Hx      Past Surgical History:  Procedure Laterality Date   BREAST BIOPSY     COLONOSCOPY     TONSILLECTOMY      Social History   Socioeconomic History   Marital status: Married    Spouse name: Not on file   Number of children: Not on file   Years of education: Not on file   Highest education level: Not on file  Occupational History   Not on file  Tobacco Use   Smoking status: Never    Passive exposure: Past   Smokeless tobacco: Never  Vaping Use   Vaping status: Never Used  Substance and Sexual Activity   Alcohol use: Yes    Comment: 2 X A WEEK 2-3 DRINKS   Drug use: No   Sexual activity: Not on file  Other Topics Concern   Not on file  Social History Narrative   Not on file   Social Determinants of Health   Financial Resource Strain: Patient Declined (03/24/2023)   [facility]   Overall Financial Resource Strain (CARDIA)    Difficulty of Paying Living Expenses: Patient declined  Food  Insecurity: Patient Declined (03/24/2023)   Received from [facility]   Hunger Vital Sign    Worried About Running Out of Food in the Last Year: Patient declined    Ran Out of Food in the Last Year: Patient declined  Transportation Needs: Patient Declined (03/24/2023)   Received from [facility] - Transportation    Lack of Transportation (Medical): Patient declined    Lack of Transportation (Non-Medical): Patient declined  Physical Activity: Patient Declined (03/24/2023)   Received from [facility]   Exercise Vital Sign    Days of Exercise per Week: Patient declined    Minutes of Exercise per Session: Patient declined  Stress: Patient Declined (03/24/2023)   Received from [facility]  [facility] - Occupational Stress Questionnaire    Feeling of Stress : Patient declined  Social Connections: Unknown (01/30/2023)   Received from [facility], [facility]   Social Network    Social Network: Not on file  Intimate Partner Violence: Unknown (01/30/2023)   Received from [facility], [facility]   HITS    Physically Hurt: Not on file    Insult or Talk Down To: Not on file    Threaten Physical Harm: Not on file    Scream or Curse: Not on file     Allergies  Allergen Reactions   Sulfa Antibiotics Other (See Comments)   Penicillins Rash     Outpatient Medications Prior to Visit  Medication Sig Dispense Refill   cholecalciferol (VITAMIN D3) 25 MCG (1000 UNIT) tablet Take 1,000 Units by mouth daily.     fluticasone (FLONASE) 50 MCG/ACT nasal spray Place 2 sprays into both nostrils daily. PRN     Multiple Vitamins-Minerals (CENTRUM SILVER ULTRA WOMENS PO) 1 tablet     tiZANidine (ZANAFLEX) 4 MG tablet 1 tablet as needed     triamcinolone cream (KENALOG) 0.1 % APPLY TOPICALLY TO AFFECTED AREA TWICE DAILY AS NEEDED     No facility-administered medications prior to visit.    Review of Systems  Constitutional:  Negative for chills, fever, malaise/fatigue and weight loss.  HENT:  Negative for hearing loss, sore throat and tinnitus.   Eyes:  Negative for blurred vision and double vision.  Respiratory:  Positive for shortness of breath. Negative for cough, hemoptysis, sputum production, wheezing and stridor.   Cardiovascular:  Negative for chest pain, palpitations, orthopnea, leg swelling and PND.  Gastrointestinal:  Negative for abdominal pain, constipation, diarrhea, heartburn, nausea and vomiting.  Genitourinary:  Negative for dysuria, hematuria and urgency.  Musculoskeletal:  Negative for joint pain and myalgias.  Skin:  Negative for itching and rash.  Neurological:  Negative for dizziness, tingling, weakness and headaches.  Endo/Heme/Allergies:  Negative for  environmental allergies. Does not bruise/bleed easily.  Psychiatric/Behavioral:  Negative for depression. The patient is not nervous/anxious and does not have insomnia.   All other systems reviewed and are negative.    Objective:  Physical Exam Vitals reviewed.  Constitutional:      General: She is not in acute distress.    Appearance: She is well-developed.  HENT:     Head: Normocephalic and atraumatic.  Eyes:     General: No scleral icterus.    Conjunctiva/sclera: Conjunctivae normal.     Pupils: Pupils are equal, round, and reactive to light.  Neck:     Vascular: No JVD.     Trachea: No tracheal deviation.  Cardiovascular:     Rate and Rhythm: Normal rate  and regular rhythm.     Heart sounds: Normal heart sounds. No murmur heard. Pulmonary:     Effort: Pulmonary effort is normal. No tachypnea, accessory muscle usage or respiratory distress.     Breath sounds: No stridor. No wheezing, rhonchi or rales.  Abdominal:     General: There is no distension.     Palpations: Abdomen is soft.     Tenderness: There is no abdominal tenderness.  Musculoskeletal:        General: No tenderness.     Cervical back: Neck supple.  Lymphadenopathy:     Cervical: No cervical adenopathy.  Skin:    General: Skin is warm and dry.     Capillary Refill: Capillary refill takes less than 2 seconds.     Findings: No rash.  Neurological:     Mental Status: She is alert and oriented to person, place, and time.  Psychiatric:        Behavior: Behavior normal.      Vitals:   05/02/23 0933  BP: 112/60  Pulse: 71  Resp: 16  SpO2: 97%  Weight: 132 lb 9.6 oz (60.1 kg)  Height: 5\' 6"  (1.676 m)   97% on RA BMI Readings from Last 3 Encounters:  05/02/23 21.40 kg/m  05/02/23 21.37 kg/m  02/04/23 22.56 kg/m   Wt Readings from Last 3 Encounters:  05/02/23 132 lb 9.6 oz (60.1 kg)  05/02/23 132 lb 6.4 oz (60.1 kg)  02/04/23 139 lb 12.8 oz (63.4 kg)     CBC No results found for: "WBC",  "RBC", "HGB", "HCT", "PLT", "MCV", "MCH", "MCHC", "RDW", "LYMPHSABS", "MONOABS", "EOSABS", "BASOSABS"   Chest Imaging: 01/10/2023 CT chest: Upper lobe predominant bronchiectasis scattered nodules. The patient's images have been independently reviewed by me.    Pulmonary Functions Testing Results:    Latest Ref Rng & Units 05/02/2023    8:21 AM  PFT Results  FVC-Pre L 3.37  P  FVC-Predicted Pre % 101  P  FVC-Post L 3.46  P  FVC-Predicted Post % 104  P  Pre FEV1/FVC % % 78  P  Post FEV1/FCV % % 81  P  FEV1-Pre L 2.63  P  FEV1-Predicted Pre % 104  P  FEV1-Post L 2.82  P  DLCO uncorrected ml/min/mmHg 25.77  P  DLCO UNC% % 122  P  DLCO corrected ml/min/mmHg 25.77  P  DLCO COR %Predicted % 122  P  DLVA Predicted % 130  P  TLC L 5.46  P  TLC % Predicted % 102  P  RV % Predicted % 91  P    P Preliminary result  05/02/23 FVC 3.46 (104%) FEV1 2.82 (111%) Ratio 81  TLC 102% DLCO 122% Interpretation: Normal PFTs  FeNO:   Pathology:   Echocardiogram:   Heart Catheterization:        Assessment & Plan:   No diagnosis found.   Discussion:  This is a 67 year old female, recently had CT imaging of the chest that demonstrated upper lobe bronchiectasis, areas of traction, bronchial thickening, scattered small pulmonary nodules as well as some calcified pulmonary nodules within the chest concerning for potential underlying granulomatous disease.  No previous diagnosis of sarcoidosis.  Plan: Her CT imaging to me is suggested of sarcoidosis. She has not had any biopsies to prove presence of noncaseating granuloma. She currently does not have any respiratory symptoms able to completely her ADLs. Will obtain pulmonary function tests. We will also have HRCT imaging complete in 3 months this  will also follow-up on her pulmonary nodules. I have recommended her to have a 30-minute consultation with Dr. Everardo All in her sarcoid clinic. Will have her follow-up in 3 months.  In the future  if needed we could always consider biopsy of her lung to help confirm diagnosis.   We discussed the clinical course of sarcoid and management including serial PFTs, labs, eye exam, and EKG and chest imaging if indicated. If symptoms suggest sarcoid flare in the future, we would manage with steroids +/- biologics.   Suspected sarcoid Asymptomatic.  Reviewed PFTs. 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  --Annual PFTs.  Last PFTs 04/2023 --Annual ophthalmology exam.   --Labs per PCP  Follow-up in 1 year  Current Outpatient Medications:    cholecalciferol (VITAMIN D3) 25 MCG (1000 UNIT) tablet, Take 1,000 Units by mouth daily., Disp: , Rfl:    fluticasone (FLONASE) 50 MCG/ACT nasal spray, Place 2 sprays into both nostrils daily. PRN, Disp: , Rfl:    Multiple Vitamins-Minerals (CENTRUM SILVER ULTRA WOMENS PO), 1 tablet, Disp: , Rfl:    tiZANidine (ZANAFLEX) 4 MG tablet, 1 tablet as needed, Disp: , Rfl:    triamcinolone cream (KENALOG) 0.1 %, APPLY TOPICALLY TO AFFECTED AREA TWICE DAILY AS NEEDED, Disp: , Rfl:    Pamela Brahm Mechele Collin, DO [place] [facility] 05/02/2023 10:07 AM

## 2023-05-15 DIAGNOSIS — Z23 Encounter for immunization: Secondary | ICD-10-CM | POA: Diagnosis not present

## 2023-05-19 DIAGNOSIS — H2513 Age-related nuclear cataract, bilateral: Secondary | ICD-10-CM | POA: Diagnosis not present

## 2023-05-19 DIAGNOSIS — H5203 Hypermetropia, bilateral: Secondary | ICD-10-CM | POA: Diagnosis not present

## 2023-05-21 DIAGNOSIS — M4120 Other idiopathic scoliosis, site unspecified: Secondary | ICD-10-CM | POA: Diagnosis not present

## 2023-05-21 DIAGNOSIS — M546 Pain in thoracic spine: Secondary | ICD-10-CM | POA: Diagnosis not present

## 2023-05-22 DIAGNOSIS — Z0100 Encounter for examination of eyes and vision without abnormal findings: Secondary | ICD-10-CM | POA: Diagnosis not present

## 2023-05-23 DIAGNOSIS — Z1231 Encounter for screening mammogram for malignant neoplasm of breast: Secondary | ICD-10-CM | POA: Diagnosis not present

## 2023-05-27 DIAGNOSIS — S39012D Strain of muscle, fascia and tendon of lower back, subsequent encounter: Secondary | ICD-10-CM | POA: Diagnosis not present

## 2023-06-04 DIAGNOSIS — S39012D Strain of muscle, fascia and tendon of lower back, subsequent encounter: Secondary | ICD-10-CM | POA: Diagnosis not present

## 2024-04-22 DIAGNOSIS — H524 Presbyopia: Secondary | ICD-10-CM | POA: Diagnosis not present

## 2024-04-22 DIAGNOSIS — H2513 Age-related nuclear cataract, bilateral: Secondary | ICD-10-CM | POA: Diagnosis not present

## 2024-04-22 DIAGNOSIS — H40033 Anatomical narrow angle, bilateral: Secondary | ICD-10-CM | POA: Diagnosis not present

## 2024-04-26 ENCOUNTER — Ambulatory Visit: Admitting: Pulmonary Disease

## 2024-04-26 DIAGNOSIS — D869 Sarcoidosis, unspecified: Secondary | ICD-10-CM | POA: Diagnosis not present

## 2024-04-26 DIAGNOSIS — J479 Bronchiectasis, uncomplicated: Secondary | ICD-10-CM

## 2024-04-26 LAB — PULMONARY FUNCTION TEST
DL/VA % pred: 121 %
DL/VA: 4.95 ml/min/mmHg/L
DLCO unc % pred: 116 %
DLCO unc: 24.26 ml/min/mmHg
FEF 25-75 Post: 2.27 L/s
FEF 25-75 Pre: 2.07 L/s
FEF2575-%Change-Post: 9 %
FEF2575-%Pred-Post: 110 %
FEF2575-%Pred-Pre: 100 %
FEV1-%Change-Post: 1 %
FEV1-%Pred-Post: 106 %
FEV1-%Pred-Pre: 104 %
FEV1-Post: 2.66 L
FEV1-Pre: 2.61 L
FEV1FVC-%Change-Post: 5 %
FEV1FVC-%Pred-Pre: 99 %
FEV6-%Change-Post: -2 %
FEV6-%Pred-Post: 106 %
FEV6-%Pred-Pre: 109 %
FEV6-Post: 3.34 L
FEV6-Pre: 3.44 L
FEV6FVC-%Change-Post: 0 %
FEV6FVC-%Pred-Post: 104 %
FEV6FVC-%Pred-Pre: 103 %
FVC-%Change-Post: -2 %
FVC-%Pred-Post: 102 %
FVC-%Pred-Pre: 105 %
FVC-Post: 3.35 L
FVC-Pre: 3.45 L
Post FEV1/FVC ratio: 79 %
Post FEV6/FVC ratio: 100 %
Pre FEV1/FVC ratio: 75 %
Pre FEV6/FVC Ratio: 100 %
RV % pred: 89 %
RV: 2.04 L
TLC % pred: 101 %
TLC: 5.45 L

## 2024-04-26 NOTE — Patient Instructions (Signed)
 Full pft performed today

## 2024-04-26 NOTE — Progress Notes (Signed)
 Full pft performed today

## 2024-04-30 ENCOUNTER — Encounter (HOSPITAL_BASED_OUTPATIENT_CLINIC_OR_DEPARTMENT_OTHER): Payer: Self-pay | Admitting: Pulmonary Disease

## 2024-04-30 ENCOUNTER — Ambulatory Visit (HOSPITAL_BASED_OUTPATIENT_CLINIC_OR_DEPARTMENT_OTHER): Admitting: Pulmonary Disease

## 2024-04-30 VITALS — BP 128/75 | HR 63 | Ht 66.0 in | Wt 133.0 lb

## 2024-04-30 DIAGNOSIS — D869 Sarcoidosis, unspecified: Secondary | ICD-10-CM | POA: Diagnosis not present

## 2024-04-30 NOTE — Progress Notes (Signed)
 Synopsis: Referred in June 2024 for abnormal CT chest by Kassie Acquanetta Bradley, MD  Subjective:   PATIENT ID: Pamela Griffith GENDER: female DOB: 1955-09-21, MRN: [id number]  Chief Complaint  Patient presents with   Follow-up    PFT     68 year old female never smoker with suspected sarcoid, HLD, osteoporosis, arthritis, heart palpitations who presents for follow-up suspected sarcoid. Family hx cardiac.   Synopsis: Initially she reports symptoms of influenza pneumonia in 12/2022 which led to CT Chest 01/10/23 with chronic perihilar lung disease, traction bronchiectasis and scarring. Several calcified granulomas. Scattered non calcified ill defined solid and part solid nodules in both lungs. RUL 1 x 0.5 cm, perifisural 0.6 cm in the RML and 0.4 cm LUL nodule. She reports prolonged sickness manifested as a sinus infection in November and May lasting >1 month. She is active with exercise 5 days a week. Denies shortness of breath cough or wheezing.   04/30/24 Since our last visit denies chronic respiratory symptoms. No significant illnesses since our last visit. She is active at baseline and working on elliptical and walking 3 miles five days a week. Slowing down compared to her prior endurance levels but overall no limitations in activity.  Past Medical History:  Diagnosis Date   Abnormal heart rhythm    Arthritis    Bruxism    Hyperlipidemia    Osteoporosis    Sleep apnea    PT.DENIES     Family History  Problem Relation Age of Onset   Heart disease Mother    Cancer Mother    Breast cancer Mother    Colon polyps Father    Heart disease Father    Breast cancer Maternal Aunt    Crohn's disease Daughter    Colon cancer Neg Hx    Esophageal cancer Neg Hx    Rectal cancer Neg Hx    Stomach cancer Neg Hx      Past Surgical History:  Procedure Laterality Date   BREAST BIOPSY     COLONOSCOPY     TONSILLECTOMY      Social History   Socioeconomic History   Marital status:  Married    Spouse name: Not on file   Number of children: Not on file   Years of education: Not on file   Highest education level: Not on file  Occupational History   Not on file  Tobacco Use   Smoking status: Never    Passive exposure: Past   Smokeless tobacco: Never  Vaping Use   Vaping status: Never Used  Substance and Sexual Activity   Alcohol use: Yes    Comment: 2 X A WEEK 2-3 DRINKS   Drug use: No   Sexual activity: Not on file  Other Topics Concern   Not on file  Social History Narrative   Not on file   Social Drivers of Health   Financial Resource Strain: Patient Declined (03/24/2023)   Received from [facility]   Overall Financial Resource Strain (CARDIA)    Difficulty of Paying Living Expenses: Patient declined  Food Insecurity: Patient Declined (03/24/2023)   Received from [facility]   Hunger Vital Sign    Within the past 12 months, you worried that your food would run out before you got the money to buy more.: Patient declined    Within the past 12 months, the food you bought just didn't last and you didn't have money to get more.: Patient declined  Transportation Needs: Patient Declined (03/24/2023)  Received from [facility]   PRAPARE - Transportation    Lack of Transportation (Medical): Patient declined    Lack of Transportation (Non-Medical): Patient declined  Physical Activity: Patient Declined (03/24/2023)   Received from [facility]   Exercise Vital Sign    On average, how many days per week do you engage in moderate to strenuous exercise (like a brisk walk)?: Patient declined    On average, how many minutes do you engage in exercise at this level?: Patient declined  Stress: Patient Declined (03/24/2023)   Received from [facility] of Occupational Health - Occupational Stress Questionnaire    Feeling of Stress : Patient declined  Social Connections: Unknown (01/30/2023)   Received from [facility]   Social Network    Social  Network: Not on file  Intimate Partner Violence: Unknown (01/30/2023)   Received from [facility]   HITS    Physically Hurt: Not on file    Insult or Talk Down To: Not on file    Threaten Physical Harm: Not on file    Scream or Curse: Not on file     Allergies  Allergen Reactions   Sulfa Antibiotics Other (See Comments)   Penicillins Rash     Outpatient Medications Prior to Visit  Medication Sig Dispense Refill   cholecalciferol (VITAMIN D3) 25 MCG (1000 UNIT) tablet Take 1,000 Units by mouth daily.     fluticasone (FLONASE) 50 MCG/ACT nasal spray Place 2 sprays into both nostrils daily. PRN     Multiple Vitamins-Minerals (CENTRUM SILVER ULTRA WOMENS PO) 1 tablet     tiZANidine (ZANAFLEX) 4 MG tablet 1 tablet as needed     triamcinolone cream (KENALOG) 0.1 % APPLY TOPICALLY TO AFFECTED AREA TWICE DAILY AS NEEDED     No facility-administered medications prior to visit.    Review of Systems  Constitutional:  Negative for chills, diaphoresis, fever, malaise/fatigue and weight loss.  HENT:  Negative for congestion.   Respiratory:  Negative for cough, hemoptysis, sputum production, shortness of breath and wheezing.   Cardiovascular:  Negative for chest pain, palpitations and leg swelling.     Objective:     Vitals:   04/30/24 1007  BP: 128/75  Pulse: 63  SpO2: 99%  Weight: 133 lb (60.3 kg)  Height: 5' 6 (1.676 m)   99% on RA BMI Readings from Last 3 Encounters:  04/30/24 21.47 kg/m  05/02/23 21.40 kg/m  05/02/23 21.37 kg/m   Wt Readings from Last 3 Encounters:  04/30/24 133 lb (60.3 kg)  05/02/23 132 lb 9.6 oz (60.1 kg)  05/02/23 132 lb 6.4 oz (60.1 kg)   Physical Exam: General: Well-appearing, no acute distress HENT: [place], AT Eyes: EOMI, no scleral icterus Respiratory: Clear to auscultation bilaterally.  No crackles, wheezing or rales Cardiovascular: RRR, -M/R/G, no JVD Extremities:-Edema,-tenderness Neuro: AAO x4, CNII-XII grossly intact Psych: Normal  mood, normal affect  CBC No results found for: WBC, RBC, HGB, HCT, PLT, MCV, MCH, MCHC, RDW, LYMPHSABS, MONOABS, EOSABS, BASOSABS   Chest Imaging: 01/10/2023 CT chest: Upper lobe predominant bronchiectasis scattered nodules.  04/07/23 CT Chest - upper and midlung traction bronchiectasis, architectural distortion and peribronchosvascular nodularity. Unchanged compared to prior  Pulmonary Functions Testing Results:    Latest Ref Rng & Units 04/26/2024    2:45 PM 05/02/2023    8:21 AM  PFT Results  FVC-Pre L 3.45  P 3.37   FVC-Predicted Pre % 105  P 101   FVC-Post L 3.35  P 3.46   FVC-Predicted Post % 102  P 104   Pre FEV1/FVC % % 75  P 78   Post FEV1/FCV % % 79  P 81   FEV1-Pre L 2.61  P 2.63   FEV1-Predicted Pre % 104  P 104   FEV1-Post L 2.66  P 2.82   DLCO uncorrected ml/min/mmHg 24.26  P 25.77   DLCO UNC% % 116  P 122   DLCO corrected ml/min/mmHg  25.77   DLCO COR %Predicted %  122   DLVA Predicted % 121  P 130   TLC L 5.45  P 5.46   TLC % Predicted % 101  P 102   RV % Predicted % 89  P 91     P Preliminary result  05/02/23 FVC 3.46 (104%) FEV1 2.82 (111%) Ratio 81  TLC 102% DLCO 122% Interpretation: Normal PFTs  04/30/24 FVC 3.35 (102%) FEV1 2.66 (106%) Ratio 79  TLC 101% DLCO 116% Interpretation: Normal pulmonary function tests   Assessment & Plan:     ICD-10-CM   1. Sarcoidosis  D86.9 CT Chest Wo Contrast       Discussion: We discussed the clinical course of sarcoid and management including serial PFTs, labs, eye exam, and EKG and chest imaging if indicated. If symptoms suggest sarcoid flare in the future, we would manage with steroids +/- biologics.  Suspected sarcoid Asymptomatic. No indication for immunosuppression. Would need biopsy +/- PET in the future if she did become symptomatic Reviewed PFTs. Normal.  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 ORDERED  Sarcoid Monitoring --Recent chest imaging reviewed. Last CT in 2024 --Annual PFTs.  Last PFTs 04/2024 --Annual ophthalmology exam. 04/2024. No active sarcoid --Labs per PCP. No faxes received  Follow-up in 1 year  Current Outpatient Medications:    cholecalciferol (VITAMIN D3) 25 MCG (1000 UNIT) tablet, Take 1,000 Units by mouth daily., Disp: , Rfl:    fluticasone (FLONASE) 50 MCG/ACT nasal spray, Place 2 sprays into both nostrils daily. PRN, Disp: , Rfl:    Multiple Vitamins-Minerals (CENTRUM SILVER ULTRA WOMENS PO), 1 tablet, Disp: , Rfl:    tiZANidine (ZANAFLEX) 4 MG tablet, 1 tablet as needed, Disp: , Rfl:    triamcinolone cream (KENALOG) 0.1 %, APPLY TOPICALLY TO AFFECTED AREA TWICE DAILY AS NEEDED, Disp: , Rfl:   I have spent a total time of 30-minutes on the day of the appointment including chart review, data review, collecting history, coordinating care and discussing medical diagnosis and plan with the patient/family. Past medical history, allergies, medications were reviewed. Pertinent imaging, labs and tests included in this note have been reviewed and interpreted independently by me.   Xue Low Slater Staff, MD  Pulmonary Critical Care 04/30/2024 10:32 AM

## 2024-04-30 NOTE — Patient Instructions (Signed)
 Suspected sarcoid Asymptomatic. No indication for immunosuppression.  Reviewed PFTs. Normal.  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 ORDERED

## 2024-04-30 NOTE — Telephone Encounter (Signed)
 FYI

## 2024-05-03 ENCOUNTER — Ambulatory Visit (HOSPITAL_BASED_OUTPATIENT_CLINIC_OR_DEPARTMENT_OTHER): Admitting: Pulmonary Disease

## 2024-05-17 DIAGNOSIS — Z23 Encounter for immunization: Secondary | ICD-10-CM | POA: Diagnosis not present

## 2024-06-08 DIAGNOSIS — Z1231 Encounter for screening mammogram for malignant neoplasm of breast: Secondary | ICD-10-CM | POA: Diagnosis not present

## 2024-06-29 ENCOUNTER — Encounter (HOSPITAL_BASED_OUTPATIENT_CLINIC_OR_DEPARTMENT_OTHER): Payer: Self-pay

## 2024-07-02 DIAGNOSIS — H40013 Open angle with borderline findings, low risk, bilateral: Secondary | ICD-10-CM | POA: Diagnosis not present

## 2024-07-02 DIAGNOSIS — H40033 Anatomical narrow angle, bilateral: Secondary | ICD-10-CM | POA: Diagnosis not present

## 2024-07-02 DIAGNOSIS — Z0100 Encounter for examination of eyes and vision without abnormal findings: Secondary | ICD-10-CM | POA: Diagnosis not present

## 2024-07-14 ENCOUNTER — Ambulatory Visit (HOSPITAL_BASED_OUTPATIENT_CLINIC_OR_DEPARTMENT_OTHER): Admitting: Cardiovascular Disease

## 2024-07-14 ENCOUNTER — Encounter (HOSPITAL_BASED_OUTPATIENT_CLINIC_OR_DEPARTMENT_OTHER): Payer: Self-pay | Admitting: Cardiovascular Disease

## 2024-07-14 VITALS — BP 122/76 | HR 74 | Ht 67.0 in | Wt 137.5 lb

## 2024-07-14 DIAGNOSIS — D869 Sarcoidosis, unspecified: Secondary | ICD-10-CM

## 2024-07-14 DIAGNOSIS — G4733 Obstructive sleep apnea (adult) (pediatric): Secondary | ICD-10-CM | POA: Diagnosis not present

## 2024-07-14 MED ORDER — PRAVASTATIN SODIUM 20 MG PO TABS: 20.0000 mg | ORAL_TABLET | Freq: Every evening | ORAL | 3 refills | Status: AC

## 2024-07-14 NOTE — Progress Notes (Signed)
 " Cardiology Office Note:  .   Date:  08/09/2024  ID:  Pamela Griffith, DOB 07-12-1956, MRN [id number] PCP: Pamela Lamarr RAMAN, MD  [facility] Providers Cardiologist:  None    History of Present Illness: .    Pamela Griffith is a 68 y.o. female with SVT, hyperlipidemia, aortic atherosclerosis, and suspected sarcoidosis here to establish care.  She has previously saw cardiology at [facility], last seen in 2024.  She reported palpitations for the preceding 10 years.  She noted them especially after drinking caffeine or alcohol.  Symptoms were worse in the preceding 6 months and seemed to occur mostly at night.  She wore an ambulatory monitor that revealed brief episodes of SVT and was started on metoprolol succinate.  She had not started taking it yet at that time.  Echocardiogram 02/2023 reportedly revealed LVEF 65% with no significant abnormalities.  She had a chest CT 12/2022 which revealed aortic atherosclerosis and concern for possible sarcoidosis.  They recommended that she start pravastatin .  She saw Dr. Kassie who recommended continuing to monitor at this time given that she had no symptoms of sarcoidosis and considering a biopsy plus minus PET scan in the future if she were to develop symptoms.  Discussed the use of AI scribe software for clinical note transcription with the patient, who gave verbal consent to proceed.  History of Present Illness Ms. Pamela Griffith presents for a cardiology consultation due to previous concerns with an EKG performed in the summer of 2024, which led to a referral to a cardiologist outside her preferred health system. She experienced difficulties with record sharing between systems and decided to seek care within the [facility] system.  She has a history of high cholesterol, previously described as 'high good cholesterol,' and has not been on any medications for it. A previous cardiologist recommended statins, but she did not start them due to concerns about  potential side effects.  She experiences heart palpitations, which were evaluated with a Holter monitor that returned inconclusive results. The palpitations occur momentarily, often at night, and sometimes when sitting quietly. She has been taking a magnesium complex supplement, which she believes has helped reduce the frequency of palpitations. She has not started metoprolol, which was previously suggested.  Her exercise routine includes using an elliptical for 30 minutes three times a week and walking three miles on alternate days. She monitors her heart rate during these activities, noting it can reach the 150s during elliptical use and averages 114 during walks. She experiences palpitations occasionally when sitting.  She has a significant family history of heart disease, particularly on her mother's side, with multiple relatives experiencing heart issues. Her mother, aged 5, has congestive heart failure, and other relatives have had heart surgeries or died from heart attacks. There is no family history of strokes or chronic kidney disease.  Socially, she does not smoke and consumes alcohol only on weekends, with a couple of glasses of wine. She is cautious about her diet, cooking most meals at home and avoiding sugar and fried foods. Her husband is diabetic, which influences her dietary choices. She is concerned about starting statins due to potential effects on her blood sugar, as her A1c is borderline at 5.6, and her glucose levels have historically been high.   ROS:  As per HPI  Studies Reviewed: SABRA   EKG Interpretation Date/Time:  Wednesday July 14 2024 10:43:21 EST Ventricular Rate:  82 PR Interval:  152 QRS Duration:  80 QT Interval:  368 QTC Calculation: 429 R Axis:   79  Text Interpretation: Normal sinus rhythm Incomplete right bundle branch block Biatrial enlargement No previous ECGs available Confirmed by Griffith Pamela ([id number]) on 07/14/2024 11:03:00 AM     Risk  Assessment/Calculations:          Physical Exam:   VS:  BP 122/76 (BP Location: Right Arm, Patient Position: Sitting, Cuff Size: Normal)   Pulse 74   Ht 5' 7 (1.702 m)   Wt 137 lb 8 oz (62.4 kg)   SpO2 99%   BMI 21.54 kg/m  , BMI Body mass index is 21.54 kg/m. GENERAL:  Well appearing HEENT: Pupils equal round and reactive, fundi not visualized, oral mucosa unremarkable NECK:  No jugular venous distention, waveform within normal limits, carotid upstroke brisk and symmetric, no bruits, no thyromegaly LUNGS:  Clear to auscultation bilaterally HEART:  RRR.  PMI not displaced or sustained,S1 and S2 within normal limits, no S3, no S4, no clicks, no rubs, no murmurs ABD:  Flat, positive bowel sounds normal in frequency in pitch, no bruits, no rebound, no guarding, no midline pulsatile mass, no hepatomegaly, no splenomegaly EXT:  2 plus pulses throughout, no edema, no cyanosis no clubbing SKIN:  No rashes no nodules NEURO:  Cranial nerves II through XII grossly intact, motor grossly intact throughout PSYCH:  Cognitively intact, oriented to person place and time   ASSESSMENT AND PLAN: .    Assessment & Plan # Paroxysmal supraventricular tachycardia (SVT) Intermittent SVT episodes, brief and non-threatening. Magnesium effective in reducing frequency. - Continue magnesium supplementation. - Avoid electrolyte imbalances, excessive caffeine, and alcohol.  # Hyperlipidemia with aortic plaque Hyperlipidemia with aortic plaque. Pravastatin  chosen for lower glucose impact. Discussed PCSK9 inhibitors, insurance may require statin trial. - Initiated low-dose pravastatin . - Monitor for muscle aches and memory issues. - Follow-up in 2-3 months for blood work including CMP and A1c. - Check LP(a)   # Borderline elevated hemoglobin A1c (prediabetes) A1c at 5.6. Concerns about statin impact on glucose. Current healthy lifestyle. - Continue diet and exercise. - Monitor A1c levels during  follow-up.    Dispo: f/u 4 months  Signed, Pamela Raford, MD   "

## 2024-07-14 NOTE — Patient Instructions (Signed)
 Medication Instructions:   START Pravastatin  one (1) tablet by mouth ( 20 mg) daily.  *If you need a refill on your cardiac medications before your next appointment, please call your pharmacy*  Lab Work:  Your physician recommends that you return for a FASTING lipid profile/lpa/A1c/cmp, in 3 months fasting after midnight. Patient given paperwork today.    If you have labs (blood work) drawn today and your tests are completely normal, you will receive your results only by: MyChart Message (if you have MyChart) OR A paper copy in the mail If you have any lab test that is abnormal or we need to change your treatment, we will call you to review the results.  Testing/Procedures:  None ordered.  Follow-Up: At [facility], you and your health needs are our priority.  As part of our continuing mission to provide you with exceptional heart care, our providers are all part of one team.  This team includes your primary Cardiologist (physician) and Advanced Practice Providers or APPs (Physician Assistants and Nurse Practitioners) who all work together to provide you with the care you need, when you need it.  Your next appointment:   4 month(s)  Provider:   Annabella Scarce, MD    We recommend signing up for the patient portal called MyChart.  Sign up information is provided on this After Visit Summary.  MyChart is used to connect with patients for Virtual Visits (Telemedicine).  Patients are able to view lab/test results, encounter notes, upcoming appointments, etc.  Non-urgent messages can be sent to your provider as well.   To learn more about what you can do with MyChart, go to [email or web address].

## 2024-07-30 ENCOUNTER — Ambulatory Visit

## 2024-07-30 DIAGNOSIS — B351 Tinea unguium: Secondary | ICD-10-CM

## 2024-07-30 NOTE — Progress Notes (Signed)
 Subjective:  Patient ID: Pamela Griffith, female    DOB: [birth year],  MRN: [id number]  Chief Complaint  Patient presents with   Nail Problem    Rm16 Patient  complains of nail fungus bilateral toenails wants to discuss treatment options/ yellow discoloration    68 y.o. female presents with the above complaint.  She states that she has had yellow color changes to her bilateral toenails for a few months.  She thinks that she may have gotten it from a pedicurist.  She is curious about treatment options.  She did recently start a statin and is concerned about the effects of terbinafine on her liver.  She was previously prescribed terbinafine by different provider.  Review of Systems: Negative except as noted in the HPI. Denies N/V/F/Ch.  Past Medical History:  Diagnosis Date   Abnormal heart rhythm    Arrhythmia    Arthritis    Bruxism    Hyperlipidemia    Osteoporosis    Sleep apnea    PT.DENIES   Current Medications[1]  Tobacco Use History[2]  Allergies[3] Objective:  There were no vitals filed for this visit. There is no height or weight on file to calculate BMI. Constitutional Well developed. Well nourished. Oriented to person, place, and time.  Vascular Dorsalis pedis pulses palpable bilaterally. Posterior tibial pulses palpable bilaterally. Capillary refill normal to all digits.  No cyanosis or clubbing noted. Pedal hair growth normal.  Neurologic Normal speech. Epicritic sensation to light touch grossly present bilaterally. Negative tinel sign at tarsal tunnel bilaterally.   Dermatologic Skin texture and turgor are within normal limits.  No open wounds. No skin lesions. Mild discoloration and thickening to toenails 1 through 5 bilaterally.  No change in texture.  Musculoskeletal: 5 out of 5 muscle strength all major pedal muscle groups, no contributing deformity   Assessment:   1. Onychomycosis    Plan:  - Patient was evaluated and treated and all  questions answered.  Onychomycosis - Discussed the diagnosis of onychomycosis with the patient.  We discussed different treatment options ranging from oral medication such as terbinafine to periodic debridements.  We also discussed laser therapy, patient is interested in this as she wishes to avoid any harmful effects to her liver. She is not interested in Penlac.  - Discussed the use of laser therapy for her onychomycosis.  She will start with 3 sessions, 1 month apart each and then will be reevaluated to determine effectiveness and if she requires any more sessions. - Return to clinic for laser therapy   Prentice Ovens, DPM AACFAS Fellowship Trained Podiatric Surgeon [facility]      [1]  Current Outpatient Medications:    cholecalciferol (VITAMIN D3) 25 MCG (1000 UNIT) tablet, Take 1,000 Units by mouth daily., Disp: , Rfl:    fluticasone (FLONASE) 50 MCG/ACT nasal spray, Place 2 sprays into both nostrils daily. PRN (Patient taking differently: Place 2 sprays into both nostrils daily as needed for rhinitis or allergies.), Disp: , Rfl:    Multiple Vitamins-Minerals (CENTRUM SILVER ULTRA WOMENS PO), 1 tablet, Disp: , Rfl:    pravastatin  (PRAVACHOL ) 20 MG tablet, Take 1 tablet (20 mg total) by mouth every evening., Disp: 90 tablet, Rfl: 3   tiZANidine (ZANAFLEX) 4 MG tablet, 1 tablet as needed, Disp: , Rfl:    triamcinolone cream (KENALOG) 0.1 %, APPLY TOPICALLY TO AFFECTED AREA TWICE DAILY AS NEEDED, Disp: , Rfl:  [2]  Social History Tobacco Use  Smoking Status  Never   Passive exposure: Past  Smokeless Tobacco Never  [3]  Allergies Allergen Reactions   Sulfa Antibiotics Other (See Comments)   Penicillins Rash

## 2024-08-09 ENCOUNTER — Encounter (HOSPITAL_BASED_OUTPATIENT_CLINIC_OR_DEPARTMENT_OTHER): Payer: Self-pay | Admitting: Cardiovascular Disease

## 2024-09-21 ENCOUNTER — Ambulatory Visit (INDEPENDENT_AMBULATORY_CARE_PROVIDER_SITE_OTHER)

## 2024-09-21 DIAGNOSIS — B351 Tinea unguium: Secondary | ICD-10-CM

## 2024-09-29 ENCOUNTER — Ambulatory Visit: Admitting: Gastroenterology

## 2024-10-19 ENCOUNTER — Ambulatory Visit

## 2024-11-11 ENCOUNTER — Ambulatory Visit (HOSPITAL_BASED_OUTPATIENT_CLINIC_OR_DEPARTMENT_OTHER): Admitting: Cardiovascular Disease

## 2024-11-30 ENCOUNTER — Ambulatory Visit
